# Patient Record
Sex: Female | Born: 1970 | Race: Black or African American | Hispanic: No | Marital: Single | Smoking: Never smoker
Health system: Southern US, Community
[De-identification: ages and names within clinical notes are randomized; demographics above are authoritative.]

## PROBLEM LIST (undated history)

## (undated) DIAGNOSIS — R000 Tachycardia, unspecified: Secondary | ICD-10-CM

## (undated) DIAGNOSIS — K5909 Other constipation: Secondary | ICD-10-CM

## (undated) DIAGNOSIS — N83209 Unspecified ovarian cyst, unspecified side: Secondary | ICD-10-CM

## (undated) DIAGNOSIS — K589 Irritable bowel syndrome without diarrhea: Secondary | ICD-10-CM

## (undated) DIAGNOSIS — D219 Benign neoplasm of connective and other soft tissue, unspecified: Secondary | ICD-10-CM

## (undated) HISTORY — PX: OVARIAN CYST SURGERY: SHX726

## (undated) HISTORY — PX: UTERINE FIBROID SURGERY: SHX826

## (undated) HISTORY — DX: Unspecified ovarian cyst, unspecified side: N83.209

---

## 2012-12-09 ENCOUNTER — Encounter (HOSPITAL_COMMUNITY): Payer: Self-pay | Admitting: *Deleted

## 2012-12-09 ENCOUNTER — Emergency Department (HOSPITAL_COMMUNITY)
Admission: EM | Admit: 2012-12-09 | Discharge: 2012-12-09 | Disposition: A | Discharge status: Home / Self Care | Payer: BC Managed Care – PPO | Attending: Emergency Medicine | Admitting: Emergency Medicine

## 2012-12-09 ENCOUNTER — Emergency Department (HOSPITAL_COMMUNITY): Payer: BC Managed Care – PPO

## 2012-12-09 DIAGNOSIS — Z792 Long term (current) use of antibiotics: Secondary | ICD-10-CM | POA: Insufficient documentation

## 2012-12-09 DIAGNOSIS — Z8719 Personal history of other diseases of the digestive system: Secondary | ICD-10-CM | POA: Insufficient documentation

## 2012-12-09 DIAGNOSIS — J3489 Other specified disorders of nose and nasal sinuses: Secondary | ICD-10-CM | POA: Insufficient documentation

## 2012-12-09 DIAGNOSIS — S9030XA Contusion of unspecified foot, initial encounter: Secondary | ICD-10-CM | POA: Insufficient documentation

## 2012-12-09 DIAGNOSIS — Y929 Unspecified place or not applicable: Secondary | ICD-10-CM | POA: Insufficient documentation

## 2012-12-09 DIAGNOSIS — W208XXA Other cause of strike by thrown, projected or falling object, initial encounter: Secondary | ICD-10-CM | POA: Insufficient documentation

## 2012-12-09 DIAGNOSIS — Z79899 Other long term (current) drug therapy: Secondary | ICD-10-CM | POA: Insufficient documentation

## 2012-12-09 DIAGNOSIS — S9031XA Contusion of right foot, initial encounter: Secondary | ICD-10-CM

## 2012-12-09 DIAGNOSIS — Z8679 Personal history of other diseases of the circulatory system: Secondary | ICD-10-CM | POA: Insufficient documentation

## 2012-12-09 DIAGNOSIS — Y939 Activity, unspecified: Secondary | ICD-10-CM | POA: Insufficient documentation

## 2012-12-09 DIAGNOSIS — R0982 Postnasal drip: Secondary | ICD-10-CM | POA: Insufficient documentation

## 2012-12-09 HISTORY — DX: Irritable bowel syndrome without diarrhea: K58.9

## 2012-12-09 HISTORY — DX: Tachycardia, unspecified: R00.0

## 2012-12-09 NOTE — ED Notes (Signed)
Right foot with small area of swelling. No discoloration at this time.

## 2012-12-09 NOTE — ED Provider Notes (Signed)
Medical screening examination/treatment/procedure(s) were performed by non-physician practitioner and as supervising physician I was immediately available for consultation/collaboration.   Charles B. Sheldon, MD 12/09/12 1529 

## 2012-12-09 NOTE — ED Provider Notes (Signed)
History     CSN: [id number]  Arrival date & time 12/09/12  4098   First MD Initiated Contact with Patient 12/09/12 [phone]      Chief Complaint  Patient presents with  . Foot Injury    (Consider location/radiation/quality/duration/timing/severity/associated sxs/prior treatment) HPI Terri Bailey is a 42 y.o. female who presents with right foot pain after dropping a case of water on it 10 hours ago.  She states that her pain is 5/10 with a shoe on and 7/10 barefoot while ambulating.  She is in little discomfort while not on the foot.  She states that she iced the foot immediately after thie incident and noticed that it hurt more this morning than immediately after it happened, so she came to have it xray-ed.  She has not taken any medications for her pain. She denies fever.  No joint pain of the ankle or toes.  Patient currently on amoxicillin for URI. Past Medical History  Diagnosis Date  . IBS (irritable bowel syndrome)   . Sinus tachycardia     Past Surgical History  Procedure Date  . Ovarian cyst surgery     No family history on file.  History  Substance Use Topics  . Smoking status: Never Smoker   . Smokeless tobacco: Not on file  . Alcohol Use: Yes     Comment: socially    OB History    Grav Para Term Preterm Abortions TAB SAB Ect Mult Living                  Review of Systems  Constitutional: Negative for fever.  HENT: Positive for congestion, rhinorrhea and postnasal drip. Negative for ear pain and neck pain.   Respiratory: Negative for cough, chest tightness and wheezing.   Cardiovascular: Negative for chest pain.  Musculoskeletal: Negative for myalgias, joint swelling and arthralgias.       Complains of pain with ambulation barefoot.  Neurological: Negative for weakness.    Allergies  Review of patient's allergies indicates no known allergies.  Home Medications   Current Outpatient Rx  Name  Route  Sig  Dispense  Refill  . AMOXICILLIN 875 MG PO  TABS   Oral   Take 875 mg by mouth 2 (two) times daily. 10 day dose started 5 days ago for URI         . BIOTIN PO   Oral   Take 1 tablet by mouth daily.         Marland Kitchen CALCIUM PO   Oral   Take 1 tablet by mouth daily.         Marland Kitchen GLUCOSAMINE PO   Oral   Take 2 tablets by mouth daily.         . ADULT MULTIVITAMIN W/MINERALS CH   Oral   Take 2 tablets by mouth daily.         Marland Kitchen FISH OIL PO   Oral   Take 2 capsules by mouth daily.         Marland Kitchen OVER THE COUNTER MEDICATION   Oral   Take 1 packet by mouth daily. Emergency Vitamin C         . POLYETHYLENE GLYCOL 3350 PO PACK   Oral   Take 17 g by mouth 2 (two) times daily.         Marland Kitchen ALIGN PO   Oral   Take 1 tablet by mouth daily.         . SUDAFED PO  Oral   Take 1 tablet by mouth every 4 (four) hours as needed. For cold symptoms           BP 105/65  Pulse 107  Temp 98.2 F (36.8 C) (Oral)  Resp 16  SpO2 98%  LMP 12/03/2012  Physical Exam  Constitutional: She is oriented to person, place, and time. She appears well-developed and well-nourished. No distress.  Neck: Normal range of motion.  Cardiovascular: Normal rate, regular rhythm and normal heart sounds.   Pulses:      Dorsalis pedis pulses are 2+ on the right side, and 2+ on the left side.  Pulmonary/Chest: Effort normal and breath sounds normal. She has no wheezes.  Musculoskeletal: She exhibits edema and tenderness.       Slight tenderness and edema over right 5th metatarsal.  No joint involvement of ankle or phalanges (normal ROM)  Lymphadenopathy:    She has no cervical adenopathy.  Neurological: She is alert and oriented to person, place, and time.  Reflex Scores:      Achilles reflexes are 2+ on the right side and 2+ on the left side. Skin: Skin is warm and dry. No rash noted.    ED Course  Procedures (including critical care time)  Labs Reviewed - No data to display No results found. No results found for this or any previous  visit. Dg Foot Complete Right  12/09/2012  *RADIOLOGY REPORT*  Clinical Data: Foot injury  RIGHT FOOT COMPLETE - 3+ VIEW  Comparison: None.  Findings: Three views of the right foot submitted.  No acute fracture or subluxation.  Small plantar spur of the calcaneus.  IMPRESSION: No acute fracture or subluxation.  Small plantar spur of the calcaneus.   Original Report Authenticated By: Natasha Mead, M.D.       No diagnosis found. 1. Right foot contusion   MDM  Right foot pain after case of water dropped on foot with negative xray.  Patient advised to take ibuprofen as needed for pain and swelling (mild at this point) and ambulate as tolerated.  She has OTC advil and does not wish for a rx at this time.  Patient told to follow-up with PCP if pain increased or does not improve.  Continue amoxicillin for URI, symptoms improving.   Patient acknowledges and agrees with this treatment plan.        Rise Traeger, PA-C 12/09/12 0957  Shadrick Senne, PA-C 12/09/12 1610

## 2012-12-09 NOTE — ED Notes (Signed)
Pt dropped a bag of water bottles on right foot last nite and having pain

## 2012-12-09 NOTE — ED Notes (Signed)
Patient transported to X-ray 

## 2013-01-02 ENCOUNTER — Encounter: Payer: Self-pay | Admitting: Obstetrics and Gynecology

## 2013-01-07 ENCOUNTER — Ambulatory Visit (INDEPENDENT_AMBULATORY_CARE_PROVIDER_SITE_OTHER): Payer: BC Managed Care – PPO | Admitting: Internal Medicine

## 2013-01-07 ENCOUNTER — Encounter: Payer: Self-pay | Admitting: Internal Medicine

## 2013-01-07 VITALS — BP 110/70 | HR 71 | Temp 97.7°F | Resp 18 | Ht 64.0 in | Wt 128.0 lb

## 2013-01-07 DIAGNOSIS — N83209 Unspecified ovarian cyst, unspecified side: Secondary | ICD-10-CM

## 2013-01-07 DIAGNOSIS — Z8679 Personal history of other diseases of the circulatory system: Secondary | ICD-10-CM

## 2013-01-07 DIAGNOSIS — K5900 Constipation, unspecified: Secondary | ICD-10-CM

## 2013-01-07 DIAGNOSIS — F40298 Other specified phobia: Secondary | ICD-10-CM

## 2013-01-07 DIAGNOSIS — I471 Supraventricular tachycardia: Secondary | ICD-10-CM | POA: Insufficient documentation

## 2013-01-07 DIAGNOSIS — K5902 Outlet dysfunction constipation: Secondary | ICD-10-CM | POA: Insufficient documentation

## 2013-01-07 DIAGNOSIS — N946 Dysmenorrhea, unspecified: Secondary | ICD-10-CM

## 2013-01-07 DIAGNOSIS — F40243 Fear of flying: Secondary | ICD-10-CM | POA: Insufficient documentation

## 2013-01-07 DIAGNOSIS — D219 Benign neoplasm of connective and other soft tissue, unspecified: Secondary | ICD-10-CM | POA: Insufficient documentation

## 2013-01-07 LAB — CBC WITH DIFFERENTIAL/PLATELET
Basophils Relative: 1 % (ref 0–1)
Eosinophils Absolute: 0.1 10*3/uL (ref 0.0–0.7)
Eosinophils Relative: 2 % (ref 0–5)
Hemoglobin: 13 g/dL (ref 12.0–15.0)
Lymphs Abs: 1.5 10*3/uL (ref 0.7–4.0)
MCH: 31.1 pg (ref 26.0–34.0)
MCHC: 34.8 g/dL (ref 30.0–36.0)
MCV: 89.5 fL (ref 78.0–100.0)
Monocytes Relative: 7 % (ref 3–12)
RBC: 4.18 MIL/uL (ref 3.87–5.11)

## 2013-01-07 MED ORDER — IBUPROFEN 800 MG PO TABS: 800.0000 mg | ORAL_TABLET | Freq: Three times a day (TID) | ORAL | Status: DC | PRN

## 2013-01-07 MED ORDER — ALPRAZOLAM 0.25 MG PO TABS: ORAL_TABLET | ORAL | Status: DC

## 2013-01-07 MED ORDER — VALACYCLOVIR HCL 500 MG PO TABS: ORAL_TABLET | ORAL | Status: DC

## 2013-01-07 NOTE — Progress Notes (Signed)
Subjective:    Patient ID: Terri Bailey, female    DOB: [birth year], 42 y.o.   MRN: [id number]  HPI  New pt here for first visit.   Has lived in [place] for the past 2 months, formerly in [place].  Moved here for employment with [employer].  PMH of dysmenorrhea managed with Ibuprofen,  Chronic constipation evaluated by GI,  Sinus tachycardia, and she uses Xanax for flying phobia.    Chronic constipation:  She reports extensive eval in [place].  Told she had a "sleepy colon".  Uses Miralax daily bid,  Tried Amitiza but could not tolerate.  She would like a second opinion  Dysmennorrhea  800 mg Ibuprofen controls  Sinus tachycardia  Extensive work up in [place] per her report including seeing and electrophysiologist.  She reports having taken Toprol for years and stopped 6 mlonths ago due to BP being too low.  She has not had any tachcardia except when she flies as she is anxious with planes  Phobia flying  Uses occasional Xanas for this  No Known Allergies Past Medical History  Diagnosis Date  . IBS (irritable bowel syndrome)   . Sinus tachycardia    Past Surgical History  Procedure Laterality Date  . Ovarian cyst surgery     History   Social History  . Marital Status: Single    Spouse Name: N/A    Number of Children: N/A  . Years of Education: N/A   Occupational History  . Not on file.   Social History Main Topics  . Smoking status: Never Smoker   . Smokeless tobacco: Not on file  . Alcohol Use: Yes     Comment: socially  . Drug Use: No  . Sexually Active: No   Other Topics Concern  . Not on file   Social History Narrative  . No narrative on file   Family History  Problem Relation Age of Onset  . Diabetes Mother   . Hypertension Mother   . Heart disease Father   . Cancer Maternal Grandmother   . Stroke Maternal Grandmother   . Hypertension Paternal Grandmother   . Hypertension Paternal Grandfather    Patient Active Problem List  Diagnosis  . Unspecified  constipation   Current Outpatient Prescriptions on File Prior to Visit  Medication Sig Dispense Refill  . BIOTIN PO Take 1 tablet by mouth daily.      Marland Kitchen CALCIUM PO Take 1 tablet by mouth daily.      Marland Kitchen GLUCOSAMINE PO Take 2 tablets by mouth daily.      . Multiple Vitamin (MULTIVITAMIN WITH MINERALS) TABS Take 2 tablets by mouth daily.      . Omega-3 Fatty Acids (FISH OIL PO) Take 2 capsules by mouth daily.      Marland Kitchen OVER THE COUNTER MEDICATION Take 1 packet by mouth daily. Emergency Vitamin C      . polyethylene glycol (MIRALAX / GLYCOLAX) packet Take 17 g by mouth 2 (two) times daily.      . Probiotic Product (ALIGN PO) Take 1 tablet by mouth daily.       No current facility-administered medications on file prior to visit.      Review of Systems    see HPI Objective:   Physical Exam Physical Exam  Nursing note and vitals reviewed.  Constitutional: She is oriented to person, place, and time. She appears well-developed and well-nourished.  HENT:  Head: Normocephalic and atraumatic.  Cardiovascular: Normal rate and regular rhythm. Exam reveals no  gallop and no friction rub.  No murmur heard.  Pulmonary/Chest: Breath sounds normal. She has no wheezes. She has no rales.  Neurological: She is alert and oriented to person, place, and time.  Skin: Skin is warm and dry.  Psychiatric: She has a normal mood and affect. Her behavior is normal.              Assessment & Plan:  Chronic constipation:   She has tried  Amitiza, Miralax,  And multiple meds and would like a second opinion.  Will set up with [facility]  Dysmenorrhea  RX for Ibuprofen 800 mg q8h prn  Hereptic cold sores  OK for Valtrex for 3 days prn for outbreaks  Flying Phobia  Ok for prn Xanax use for plane trips  She has upcoming trip to [place]  History of sinus tachycardia  Off beta blocker now .  See me in office if tachycardia becomes a problem  Schedule CPE  See me as needed prn

## 2013-01-08 LAB — COMPREHENSIVE METABOLIC PANEL
Alkaline Phosphatase: 70 U/L (ref 39–117)
BUN: 19 mg/dL (ref 6–23)
CO2: 27 mEq/L (ref 19–32)
Creat: 1.04 mg/dL (ref 0.50–1.10)
Glucose, Bld: 74 mg/dL (ref 70–99)
Total Bilirubin: 0.4 mg/dL (ref 0.3–1.2)

## 2013-01-08 LAB — LIPID PANEL
Cholesterol: 205 mg/dL — ABNORMAL HIGH (ref 0–200)
HDL: 56 mg/dL (ref >39)
LDL Cholesterol: 134 mg/dL — ABNORMAL HIGH (ref 0–99)
Total CHOL/HDL Ratio: 3.7 Ratio
Triglycerides: 74 mg/dL (ref <150)
VLDL: 15 mg/dL (ref 0–40)

## 2013-01-15 ENCOUNTER — Encounter: Payer: Self-pay | Admitting: *Deleted

## 2013-01-20 ENCOUNTER — Emergency Department (HOSPITAL_BASED_OUTPATIENT_CLINIC_OR_DEPARTMENT_OTHER): Payer: BC Managed Care – PPO

## 2013-01-20 ENCOUNTER — Emergency Department (HOSPITAL_BASED_OUTPATIENT_CLINIC_OR_DEPARTMENT_OTHER)
Admission: EM | Admit: 2013-01-20 | Discharge: 2013-01-21 | Disposition: A | Discharge status: Home / Self Care | Payer: BC Managed Care – PPO | Attending: Emergency Medicine | Admitting: Emergency Medicine

## 2013-01-20 ENCOUNTER — Encounter (HOSPITAL_BASED_OUTPATIENT_CLINIC_OR_DEPARTMENT_OTHER): Payer: Self-pay | Admitting: *Deleted

## 2013-01-20 DIAGNOSIS — K5900 Constipation, unspecified: Secondary | ICD-10-CM | POA: Insufficient documentation

## 2013-01-20 DIAGNOSIS — R300 Dysuria: Secondary | ICD-10-CM | POA: Insufficient documentation

## 2013-01-20 DIAGNOSIS — R109 Unspecified abdominal pain: Secondary | ICD-10-CM

## 2013-01-20 DIAGNOSIS — K589 Irritable bowel syndrome without diarrhea: Secondary | ICD-10-CM | POA: Insufficient documentation

## 2013-01-20 DIAGNOSIS — Z8679 Personal history of other diseases of the circulatory system: Secondary | ICD-10-CM | POA: Insufficient documentation

## 2013-01-20 DIAGNOSIS — R110 Nausea: Secondary | ICD-10-CM | POA: Insufficient documentation

## 2013-01-20 DIAGNOSIS — Z79899 Other long term (current) drug therapy: Secondary | ICD-10-CM | POA: Insufficient documentation

## 2013-01-20 DIAGNOSIS — R1012 Left upper quadrant pain: Secondary | ICD-10-CM | POA: Insufficient documentation

## 2013-01-20 HISTORY — DX: Other constipation: K59.09

## 2013-01-20 HISTORY — DX: Benign neoplasm of connective and other soft tissue, unspecified: D21.9

## 2013-01-20 LAB — CBC WITH DIFFERENTIAL/PLATELET
Basophils Absolute: 0 10*3/uL (ref 0.0–0.1)
Eosinophils Absolute: 0.1 10*3/uL (ref 0.0–0.7)
Eosinophils Relative: 1 % (ref 0–5)
HCT: 40 % (ref 36.0–46.0)
Lymphocytes Relative: 26 % (ref 12–46)
MCH: 31.5 pg (ref 26.0–34.0)
MCV: 93.2 fL (ref 78.0–100.0)
Monocytes Absolute: 0.5 10*3/uL (ref 0.1–1.0)
RDW: 11.8 % (ref 11.5–15.5)
WBC: 7.1 10*3/uL (ref 4.0–10.5)

## 2013-01-20 LAB — HEPATIC FUNCTION PANEL
ALT: 16 U/L (ref 0–35)
Alkaline Phosphatase: 81 U/L (ref 39–117)
Bilirubin, Direct: <0.1 mg/dL (ref 0.0–0.3)

## 2013-01-20 LAB — WET PREP, GENITAL: WBC, Wet Prep HPF POC: NONE SEEN

## 2013-01-20 LAB — BASIC METABOLIC PANEL
BUN: 23 mg/dL (ref 6–23)
CO2: 28 mEq/L (ref 19–32)
Chloride: 101 mEq/L (ref 96–112)
Creatinine, Ser: 1 mg/dL (ref 0.50–1.10)
Glucose, Bld: 100 mg/dL — ABNORMAL HIGH (ref 70–99)

## 2013-01-20 LAB — PREGNANCY, URINE: Preg Test, Ur: NEGATIVE

## 2013-01-20 MED ORDER — ONDANSETRON 8 MG PO TBDP: 8.0000 mg | ORAL_TABLET | Freq: Three times a day (TID) | ORAL | Status: DC | PRN

## 2013-01-20 MED ORDER — HYDROMORPHONE HCL PF 1 MG/ML IJ SOLN
1.0000 mg | Freq: Once | INTRAMUSCULAR | Status: AC
  Administered 2013-01-20: 1 mg via INTRAVENOUS
  Filled 2013-01-20: qty 1

## 2013-01-20 MED ORDER — IOHEXOL 300 MG/ML  SOLN
100.0000 mL | Freq: Once | INTRAMUSCULAR | Status: AC | PRN
  Administered 2013-01-20: 100 mL via INTRAVENOUS

## 2013-01-20 MED ORDER — HYDROCODONE-ACETAMINOPHEN 5-325 MG PO TABS: 1.0000 | ORAL_TABLET | Freq: Four times a day (QID) | ORAL | Status: DC | PRN

## 2013-01-20 MED ORDER — IOHEXOL 300 MG/ML  SOLN
50.0000 mL | Freq: Once | INTRAMUSCULAR | Status: AC | PRN
  Administered 2013-01-20: 50 mL via ORAL

## 2013-01-20 MED ORDER — ONDANSETRON HCL 4 MG/2ML IJ SOLN
4.0000 mg | Freq: Once | INTRAMUSCULAR | Status: AC
  Administered 2013-01-20: 4 mg via INTRAVENOUS
  Filled 2013-01-20: qty 2

## 2013-01-20 MED ORDER — FENTANYL CITRATE 0.05 MG/ML IJ SOLN
50.0000 ug | Freq: Once | INTRAMUSCULAR | Status: AC
  Administered 2013-01-20: 50 ug via INTRAVENOUS
  Filled 2013-01-20: qty 2

## 2013-01-20 NOTE — ED Provider Notes (Signed)
History    This chart was scribed for Derwood Kaplan, MD by Donne Anon, ED Scribe. This patient was seen in room MH01/MH01 and the patient's care was started at 2006.   CSN: [id number]  Arrival date & time 01/20/13  1843   First MD Initiated Contact with Patient 01/20/13 2006      Chief Complaint  Patient presents with  . Abdominal Pain     The history is provided by the patient. No language interpreter was used.   Terri Bailey is a 42 y.o. female who presents to the Emergency Department complaining of gradual onset, intermittent, chronic, gradually worsening moderate left upper quadrant abdominal pain which radiates downward, began 4 days ago, and is described as if "my insides are being twisted." She reports associated nausea and dysuria. She denies vomiting, fever, chills, diarrhea, hematochezia, vaginal discharge or any other pain. She has had previous episodes in the past and reports that this time feels similar. She denies noticing a correlation between any specific food and her abdominal pain. She has a h/o chronic constipation. Her last menstrual cycle was 01/03/2013 and her last bowl movement was today. She traveled overseas to [place] last week.  Past Medical History  Diagnosis Date  . IBS (irritable bowel syndrome)   . Sinus tachycardia   . Fibroid   . Constipation, chronic     Past Surgical History  Procedure Laterality Date  . Ovarian cyst surgery    . Uterine fibroid surgery      Family History  Problem Relation Age of Onset  . Diabetes Mother   . Hypertension Mother   . Heart disease Father   . Cancer Maternal Grandmother   . Stroke Maternal Grandmother   . Hypertension Paternal Grandmother   . Hypertension Paternal Grandfather     History  Substance Use Topics  . Smoking status: Never Smoker   . Smokeless tobacco: Never Used  . Alcohol Use: 0.0 oz/week    0 Glasses of wine per week     Review of Systems  Constitutional: Negative for fever and  chills.  Gastrointestinal: Positive for abdominal pain and constipation. Negative for vomiting and diarrhea.  Genitourinary: Negative for hematuria and vaginal discharge.  All other systems reviewed and are negative.    Allergies  Review of patient's allergies indicates no known allergies.  Home Medications   Current Outpatient Rx  Name  Route  Sig  Dispense  Refill  . ALPRAZolam (XANAX) 0.25 MG tablet      Take one 30 mins prior to flight   20 tablet   0   . Linaclotide (LINZESS PO)   Oral   Take by mouth.         Marland Kitchen BIOTIN PO   Oral   Take 1 tablet by mouth daily.         Marland Kitchen CALCIUM PO   Oral   Take 1 tablet by mouth daily.         Marland Kitchen GLUCOSAMINE PO   Oral   Take 2 tablets by mouth daily.         Marland Kitchen ibuprofen (ADVIL,MOTRIN) 800 MG tablet   Oral   Take 1 tablet (800 mg total) by mouth every 8 (eight) hours as needed for pain.   30 tablet   0   . Multiple Vitamin (MULTIVITAMIN WITH MINERALS) TABS   Oral   Take 2 tablets by mouth daily.         . Omega-3 Fatty Acids (FISH OIL  PO)   Oral   Take 2 capsules by mouth daily.         Marland Kitchen OVER THE COUNTER MEDICATION   Oral   Take 1 packet by mouth daily. Emergency Vitamin C         . polyethylene glycol (MIRALAX / GLYCOLAX) packet   Oral   Take 17 g by mouth 2 (two) times daily.         . Probiotic Product (ALIGN PO)   Oral   Take 1 tablet by mouth daily.         . valACYclovir (VALTREX) 500 MG tablet      Take one tablet bid for 3 days   6 tablet   1     BP 109/77  Pulse 73  Temp(Src) 97.7 F (36.5 C) (Oral)  Resp 20  SpO2 100%  LMP 01/03/2013  Physical Exam  Nursing note and vitals reviewed. Constitutional: She is oriented to person, place, and time. She appears well-developed and well-nourished. No distress.  HENT:  Head: Normocephalic and atraumatic.  Mouth/Throat: Oropharynx is clear and moist.  Eyes: EOM are normal.  Neck: Neck supple. No tracheal deviation present.   Cardiovascular: Normal rate, regular rhythm and normal heart sounds.   Pulmonary/Chest: Effort normal and breath sounds normal. No respiratory distress.  Musculoskeletal: Normal range of motion.  Neurological: She is alert and oriented to person, place, and time.  Skin: Skin is warm and dry.  Psychiatric: She has a normal mood and affect. Her behavior is normal.    ED Course  Procedures (including critical care time) DIAGNOSTIC STUDIES: Oxygen Saturation is 100% on room air, normal by my interpretation.    COORDINATION OF CARE: 8:12 PM Discussed treatment plan which includes labs with pt at bedside and pt agreed to plan.     Labs Reviewed  PREGNANCY, URINE   No results found.   No diagnosis found.    MDM  I personally performed the services described in this documentation, which was scribed in my presence. The recorded information has been reviewed and is accurate.  Pt comes in with cc of abd pain. Pt started having pain about 3 days ago, it is gradually getting worse. It is diffuse, but starts in the LUQ, no radiation to the back. She has no medical problems, no hx of GU pathology.  DDx includes: Pancreatitis Hepatobiliary pathology including cholecystitis Gastritis/PUD SBO Colitis Intra abdominal abscess Thrombosis Mesenteric ischemia Diverticulitis Peritonitis Appendicitis Hernia Nephrolithiasis Pyelonephritis UTI/Cystitis Ovarian cyst TOA Ectopic pregnancy PID STD  Will get a GU exam completed as well. Will either go with CT or US abdomen for further delineation of the pain.                Derwood Kaplan, MD 01/20/13 2102

## 2013-01-20 NOTE — ED Notes (Signed)
Burning abd pain x 2-3 days- denies n/v

## 2013-01-21 NOTE — ED Notes (Signed)
Pt asked about transportation home states she was calling a cab to transport herself home

## 2013-01-28 ENCOUNTER — Ambulatory Visit (INDEPENDENT_AMBULATORY_CARE_PROVIDER_SITE_OTHER): Payer: BC Managed Care – PPO | Admitting: Sports Medicine

## 2013-01-28 VITALS — BP 110/70 | Ht 64.0 in | Wt 120.0 lb

## 2013-01-28 DIAGNOSIS — M25562 Pain in left knee: Secondary | ICD-10-CM | POA: Insufficient documentation

## 2013-01-28 DIAGNOSIS — M25569 Pain in unspecified knee: Secondary | ICD-10-CM

## 2013-01-28 DIAGNOSIS — M25561 Pain in right knee: Secondary | ICD-10-CM

## 2013-01-28 DIAGNOSIS — R269 Unspecified abnormalities of gait and mobility: Secondary | ICD-10-CM

## 2013-01-28 NOTE — Assessment & Plan Note (Signed)
She has been in rigid orthotics  These are not giving enough correction now Make new orthotics on RTC to use for sports

## 2013-01-28 NOTE — Progress Notes (Signed)
Patient ID: Terri Bailey, female   DOB: 1971/04/05, 42 y.o.   MRN: [id number]  Patient with 1 year Hx of anterior knee pain Started after doing a lot of Zumba Had seen an MD and minor changes on Xray (in [place]) She has done 2 to 3 rounds of PT and not much change  PT did balance/ stretching/ band exercises No real hip rehab Dx was PFSS  Tried OTC creams/ icing and no change with this No significant medical conditions  No swelling No locking /  No giving way/ no rotary instability   fam HX: mother has some arthritis  Estate manager/land agent   Examination  NAD  Dynamic genius valgus on both knees on gait  Squinting patella bilat External Rots hips   Is 60  intenal is 45  she lies in int rotation Hip abduction weakness bilat Hip flexion is strong  Biilat Knee: Normal to inspection with no erythema or effusion or obvious bony abnormalities. Palpation normal with no warmth or joint line tenderness or patellar tenderness or condyle tenderness. ROM normal in flexion and extension and lower leg rotation. Ligaments with solid consistent endpoints including ACL, PCL, LCL, MCL. Negative Mcmurray's and provocative meniscal tests. Non painful patellar compression. But mild upper outer crepitation Note that she tracks laterally on both sides Patellar and quadriceps tendons unremarkable. Hamstring and quadriceps strength is normal except VMO weakness bilat  Bilat Pes planus with pronation Mostly at mid foot 1st MTP hypertrophy

## 2013-01-28 NOTE — Assessment & Plan Note (Signed)
Given HEP Focus hip for 6 exercises 2 specific exercises for VMO  Needs to do them daily Reck 6 weeks

## 2013-01-29 ENCOUNTER — Encounter: Payer: Self-pay | Admitting: Obstetrics and Gynecology

## 2013-01-29 ENCOUNTER — Ambulatory Visit: Payer: BC Managed Care – PPO | Admitting: Obstetrics and Gynecology

## 2013-01-29 VITALS — BP 92/58 | Ht 64.0 in | Wt 128.0 lb

## 2013-01-29 DIAGNOSIS — R1031 Right lower quadrant pain: Secondary | ICD-10-CM

## 2013-01-29 DIAGNOSIS — N898 Other specified noninflammatory disorders of vagina: Secondary | ICD-10-CM

## 2013-01-29 DIAGNOSIS — D219 Benign neoplasm of connective and other soft tissue, unspecified: Secondary | ICD-10-CM

## 2013-01-29 LAB — POCT URINALYSIS DIPSTICK
Blood, UA: NEGATIVE
Ketones, UA: NEGATIVE
Spec Grav, UA: 1.01
Urobilinogen, UA: NEGATIVE
pH, UA: 5

## 2013-01-29 LAB — POCT WET PREP (WET MOUNT)
Clue Cells Wet Prep Whiff POC: NEGATIVE
KOH Wet Prep POC: NEGATIVE
pH: 4.5

## 2013-01-29 LAB — POCT OSOM BVBLUE TEST: Bacterial Vaginosis: NEGATIVE

## 2013-01-29 LAB — POCT OSOM TRICHOMONAS RAPID TEST: Trichomonas vaginalis: NEGATIVE

## 2013-01-29 LAB — HIV ANTIBODY (ROUTINE TESTING W REFLEX): HIV: NONREACTIVE

## 2013-01-29 MED ORDER — KETOROLAC TROMETHAMINE 10 MG PO TABS: 10.0000 mg | ORAL_TABLET | Freq: Four times a day (QID) | ORAL | Status: DC | PRN

## 2013-01-29 NOTE — Progress Notes (Signed)
Vaginal discharge: pt states has had clear and thick  discharge with no odor  off and on x 1 year and has been on Diflucan multiple times and reoccurs  and sometimes worse than others.  Itching / Burning: Yes Pt states has had irritating burning  X 4 months and used Vagisil and noticed it didn't help. Fever: no  Symptoms have been present: Discharge x 1 year  Itching x 4 months Has used over-the-counter treatment: Vagisil Associated symptoms:  Pelvic pain: yes       Dyspareunia: no     Odor:  no  History of STD:  no history of PID, STD's STD screen: requested  Subjective:    Terri Bailey is a 42 y.o. female, G0P0000, who presents for vaginal discharge with itching on/off x 1 year. Intermittent pain on right side. No triggers. No medications taken for this. Not related to cycle  Cycle: normal every 28 days. Lot of spotting leading to bleeding. Lasting 4 days. Able to wear pad every 2 hours. Heavy on first 2-3 days. Golf ball size clots. Pain starts with heavy flow and last through heavy days. No radiation. Pain scale 8/10.Takes 800 mg of motrin every 8 hours bringing pain to 6/10   The following portions of the patient's history were reviewed and updated as appropriate: allergies, current medications, past family history.  Review of Systems Pertinent items are noted in HPI. Gastrointestinal: struggling with constipation. Just established care with Dr Loreta Ave for management of IBS Urinary:negative   Objective:    BP 92/58  Ht 5\' 4"  (1.626 m)  Wt 128 lb (58.06 kg)  BMI 21.96 kg/m2  LMP 01/03/2013    Weight:  Wt Readings from Last 1 Encounters:  01/29/13 128 lb (58.06 kg)          BMI: Body mass index is 21.96 kg/(m^2).  General Appearance: Alert, appropriate appearance for age. No acute distress HEENT: Grossly normal Neck / Thyroid: Supple, no masses, nodes or enlargement Lungs: clear to auscultation bilaterally Back: No CVA tenderness Breast Exam: deferred Cardiovascular:  Regular rate and rhythm. S1, S2, no murmur Gastrointestinal: Soft, non-tender, no masses or organomegaly Pelvic Exam: Vulva and vagina appear normal.No erythema. Moderate thick white discharge Bimanual exam reveals normal uterus and adnexa.Non-tender Rectovaginal: not indicated Lymphatic Exam: Non-palpable nodes in neck, clavicular, axillary, or inguinal regions Skin: no rash or abnormalities Neurologic: Normal gait and speech, no tremor  Psychiatric: Alert and oriented, appropriate affect.@OBJECTIVEEND @  Wet prep: 4.5 and normal OSOM BV:- OSOM Trichomonas:-   Assessment:    RLQ pain probably 2nd constipation  Vaginal discharge: normal findings today Heavy menses with severe dysmenorrhea 2nd fibroid disease   Plan:    STD screen done per pt request  Counseling:   Hygiene reviewed with avoidance of perfume, fragrance, soap, douche, feminine wash. Use unscented all-cotton sanitary pads or tampons.  Use dye and fragrance free laundry detergents for underwear.  Avoid Chlorox and dryer sheets.  Sleep without underwear.  Avoid prolonged wear of wet bathing suit or sweat-soaked tight clothes.  GC/CT done Toradol discussed: prescription given Lysteda Discussed / Information Given  Combined oral contraceptives was reviewed with the patient      With expected benefits of: cycle control, reduction in menstrual flow and dysmenorrhea, improvement of PMS and reduction of ovarian cysts and ovarian cancer. Risks of DVT/PE discussed.  Nuvaring was also reviewed With added benefit of monthly use as opposed to daily use was also reviewed.  Tobacco use: none, withouthistory of  DVT/PE. Pertinent medical history:none   Silverio Lay MD

## 2013-01-29 NOTE — Patient Instructions (Signed)
Tranexamic acid oral tablets What is this medicine? TRANEXAMIC ACID slows down or stops blood clots from being broken down. This medicine is used to treat heavy monthly menstrual bleeding. This medicine may be used for other purposes; ask your health care provider or pharmacist if you have questions. What should I tell my health care provider before I take this medicine? They need to know if you have any of these conditions: -bleeding in the brain -blood clotting problems -kidney disease -vision problems -an unusual allergic reaction to tranexamic acid, other medicines, foods, dyes, or preservatives -pregnant or trying to get pregnant -breast-feeding How should I use this medicine? Take this medicine by mouth with a glass of water. Follow the directions on the prescription label. Do not cut, crush, or chew this medicine. You can take it with or without food. If it upsets your stomach, take it with food. Take your medicine at regular intervals. Do not take it more often than directed. Do not stop taking except on your doctor's advice. Do not take this medicine until your period has started. Do not take it for more than 5 days in a row. Do not take this medicine when you do not have your period. Talk to your pediatrician regarding the use of this medicine in children. Special care may be needed. Overdosage: If you think you've taken too much of this medicine contact a poison control center or emergency room at once. Overdosage: If you think you have taken too much of this medicine contact a poison control center or emergency room at once. NOTE: This medicine is only for you. Do not share this medicine with others. What if I miss a dose? If you miss a dose, take it when you remember, and then take your next dose at least 6 hours later. Do not take more than 2 tablets at a time to make up for missed doses. What may interact with this medicine? -certain medicines used to help your blood clot or  break up blood clots -certain medicines used to treat leukemia -female hormones, like estrogens or progestins and birth control pills, patches, rings, or injections This list may not describe all possible interactions. Give your health care provider a list of all the medicines, herbs, non-prescription drugs, or dietary supplements you use. Also tell them if you smoke, drink alcohol, or use illegal drugs. Some items may interact with your medicine. What should I watch for while using this medicine? Tell your doctor or healthcare professional if your symptoms do not start to get better or if they get worse. Tell your doctor or healthcare professional if you notice any eye problems while taking this medicine. Your doctor will refer you to an eye doctor who will examine your eyes. What side effects may I notice from receiving this medicine? Side effects that you should report to your doctor or health care professional as soon as possible: -allergic reactions like skin rash, itching or hives, swelling of the face, lips, or tongue -breathing difficulties -changes in vision -sudden or severe pain in the chest, legs, head, or groin -unusually weak or tired  Side effects that usually do not require medical attention (Report these to your doctor or health care professional if they continue or are bothersome.): -back pain -headache -muscle or joint aches -sinus and nasal problems -stomach pain -tiredness This list may not describe all possible side effects. Call your doctor for medical advice about side effects. You may report side effects to FDA at 1-800-FDA-1088. Where  should I keep my medicine? Keep out of the reach of children. Store at room temperature between 15 and 30 degrees C (59 and 86 degrees F). Throw away any unused medicine after the expiration date. NOTE: This sheet is a summary. It may not cover all possible information. If you have questions about this medicine, talk to your doctor,  pharmacist, or health care provider.  2013, [employer]vier/Gold Standard. (06/07/2009 8:42:53 AM)

## 2013-01-30 LAB — GC/CHLAMYDIA PROBE AMP: CT Probe RNA: NEGATIVE

## 2013-01-30 LAB — HSV 2 ANTIBODY, IGG: HSV 2 Glycoprotein G Ab, IgG: <0.1 IV

## 2013-02-10 ENCOUNTER — Telehealth: Payer: Self-pay

## 2013-02-10 NOTE — Telephone Encounter (Signed)
Pt advised of results. Copy mailed to pt  Darien Ramus, CMA

## 2013-02-10 NOTE — Telephone Encounter (Signed)
Vm from pt wanting to get test results. LVM for pt to return call  Darien Ramus, CMA

## 2013-02-20 ENCOUNTER — Institutional Professional Consult (permissible substitution): Payer: BC Managed Care – PPO | Admitting: Cardiology

## 2013-02-20 ENCOUNTER — Encounter: Payer: BC Managed Care – PPO | Admitting: Sports Medicine

## 2013-02-28 ENCOUNTER — Institutional Professional Consult (permissible substitution): Payer: Self-pay | Admitting: Cardiovascular Disease

## 2013-03-06 ENCOUNTER — Institutional Professional Consult (permissible substitution): Payer: Self-pay | Admitting: Cardiovascular Disease

## 2013-03-31 ENCOUNTER — Encounter: Payer: Self-pay | Admitting: *Deleted

## 2013-04-08 ENCOUNTER — Ambulatory Visit: Payer: BC Managed Care – PPO | Admitting: Sports Medicine

## 2013-04-09 ENCOUNTER — Ambulatory Visit: Payer: BC Managed Care – PPO | Admitting: Sports Medicine

## 2013-04-10 ENCOUNTER — Encounter: Payer: BC Managed Care – PPO | Admitting: Internal Medicine

## 2013-04-16 ENCOUNTER — Ambulatory Visit (INDEPENDENT_AMBULATORY_CARE_PROVIDER_SITE_OTHER): Payer: BC Managed Care – PPO | Admitting: Cardiovascular Disease

## 2013-04-16 ENCOUNTER — Encounter: Payer: Self-pay | Admitting: Cardiovascular Disease

## 2013-04-16 VITALS — BP 110/76 | HR 80 | Ht 64.0 in | Wt 128.0 lb

## 2013-04-16 DIAGNOSIS — I471 Supraventricular tachycardia: Secondary | ICD-10-CM

## 2013-04-16 DIAGNOSIS — E7800 Pure hypercholesterolemia, unspecified: Secondary | ICD-10-CM

## 2013-04-16 DIAGNOSIS — E785 Hyperlipidemia, unspecified: Secondary | ICD-10-CM

## 2013-04-16 DIAGNOSIS — I498 Other specified cardiac arrhythmias: Secondary | ICD-10-CM

## 2013-04-16 MED ORDER — ATORVASTATIN CALCIUM 40 MG PO TABS: 40.0000 mg | ORAL_TABLET | Freq: Every day | ORAL | Status: DC

## 2013-04-16 NOTE — Progress Notes (Signed)
Terri Bailey Date of Birth  15-Mar-1971       [facility]    [place] [address]  [address] [place], [place]  [address]   [place], [place]  [address] [phone]     [phone]   Fax  [phone]    Fax [phone]  Problem List: 1. Palpitations  History of Present Illness:  Terri Bailey is a 42 yo with hx of tachycardia ( SVT vs. Sinus tach).   She was on toprol for years.  She stopped her toprol for hypotension and has not restarted.   She works at [employer]).   She typically exercises   She has an occasional episodes of chest discomfort but thinks it may be due to constipation. The chest pains are not related to exertion.  She is borderline elevated cholesterol levels. Her father died in his 34s of myocardial infarction. Her paternal grandmother also died at an early age-50s of MI.  She eats a very healthy diet.  Current Outpatient Prescriptions on File Prior to Visit  Medication Sig Dispense Refill  . ALPRAZolam (XANAX) 0.25 MG tablet Take one 30 mins prior to flight  20 tablet  0  . BIOTIN PO Take 1 tablet by mouth daily.      Marland Kitchen CALCIUM PO Take 1 tablet by mouth daily.      Marland Kitchen GLUCOSAMINE PO Take 2 tablets by mouth daily.      Marland Kitchen HYDROcodone-acetaminophen (NORCO/VICODIN) 5-325 MG per tablet Take 1 tablet by mouth every 6 (six) hours as needed for pain.  15 tablet  0  . ibuprofen (ADVIL,MOTRIN) 800 MG tablet Take 1 tablet (800 mg total) by mouth every 8 (eight) hours as needed for pain.  30 tablet  0  . ketorolac (TORADOL) 10 MG tablet Take 1 tablet (10 mg total) by mouth every 6 (six) hours as needed for pain.  20 tablet  4  . Linaclotide (LINZESS PO) Take by mouth.      . Multiple Vitamin (MULTIVITAMIN WITH MINERALS) TABS Take 2 tablets by mouth daily.      . Omega-3 Fatty Acids (FISH OIL PO) Take 2 capsules by mouth daily.      . ondansetron (ZOFRAN ODT) 8 MG disintegrating tablet Take 1 tablet (8 mg total) by mouth  every 8 (eight) hours as needed for nausea.  20 tablet  0  . OVER THE COUNTER MEDICATION Take 1 packet by mouth daily. Emergency Vitamin C      . polyethylene glycol (MIRALAX / GLYCOLAX) packet Take 17 g by mouth 2 (two) times daily.      . Probiotic Product (ALIGN PO) Take 1 tablet by mouth daily.      . valACYclovir (VALTREX) 500 MG tablet Take one tablet bid for 3 days  6 tablet  1   No current facility-administered medications on file prior to visit.    No Known Allergies  Past Medical History  Diagnosis Date  . IBS (irritable bowel syndrome)   . Sinus tachycardia   . Fibroid   . Constipation, chronic     Past Surgical History  Procedure Laterality Date  . Ovarian cyst surgery    . Uterine fibroid surgery      History  Smoking status  . Never Smoker   Smokeless tobacco  . Never Used    History  Alcohol Use  . 0.0 oz/week  . 0 Glasses of wine per week  Family History  Problem Relation Age of Onset  . Diabetes Mother   . Hypertension Mother   . Heart disease Father   . Cancer Maternal Grandmother   . Stroke Maternal Grandmother   . Hypertension Paternal Grandmother   . Hypertension Paternal Grandfather     Reviw of Systems:  Reviewed in the HPI.  All other systems are negative.  Physical Exam: Blood pressure 110/76, pulse 80, height 5\' 4"  (1.626 m), weight 128 lb (58.06 kg). General: Well developed, well nourished, in no acute distress.  Head: Normocephalic, atraumatic, sclera non-icteric, mucus membranes are moist,   Neck: Supple. Carotids are 2 + without bruits. No JVD   Lungs: Clear   Heart: RR, normal S1, S2, no murmurs  Abdomen: Soft, non-tender, non-distended with normal bowel sounds.  Msk:  Strength and tone are normal   Extremities: No clubbing or cyanosis. No edema.  Distal pedal pulses are 2+ and equal    Neuro: CN II - XII intact.  Alert and oriented X 3.   Psych:  Normal   ECG: Apr 16, 2013:  NSR at 80.  Normal  ECG  Assessment / Plan:

## 2013-04-16 NOTE — Patient Instructions (Addendum)
Your physician has requested that you have a stress echocardiogram.   Please follow instruction sheet as given.    Your physician recommends that you schedule a follow-up appointment in: 3 months   Your physician recommends that you return for a FASTING lipid profile: 3 month   Your physician has recommended you make the following change in your medication:   START ATORVASTATIN 40 MG TAKEN IN EVENING.

## 2013-04-16 NOTE — Assessment & Plan Note (Signed)
Her Last cholesterol levels are mildly elevated. She has a strong family history of coronary artery disease. Father died in his early 36s of MI. Also her paternal grandmother died in her 45s. Given this information I think it we should try to achieve an LDL level of 70. We will start her on atorvastatin 40 mg a day. I see her back in 3 months. We'll check fasting lipids, liver enzymes, and basic metabolic profile a day or so prior to the office visit.

## 2013-04-16 NOTE — Assessment & Plan Note (Signed)
I have reviewed her monitor traces. I think the tachycardia is due to supraventricular tachycardia. She typically will go from a heart rate of 80-180 ms. This is not typically associated with exertion.  She had lots of episodes of SVT around 10 years ago. She was on Toprol up until 6 months ago.  At this point she's not having much in the way of arrhythmias. We discussed 3 ways to terminate SVT such as Valsalva maneuver, diving reflex, and carotid sinus massage. We discussed the use of PRN  Propranolol.  At this point,  We will not restart the Toprol.  I will see her in 3 months.

## 2013-04-23 ENCOUNTER — Telehealth: Payer: Self-pay | Admitting: *Deleted

## 2013-04-23 NOTE — Telephone Encounter (Signed)
Left VM message regarding appt

## 2013-04-29 ENCOUNTER — Ambulatory Visit (INDEPENDENT_AMBULATORY_CARE_PROVIDER_SITE_OTHER): Payer: BC Managed Care – PPO | Admitting: Sports Medicine

## 2013-04-29 VITALS — BP 109/74 | Ht 64.0 in | Wt 125.0 lb

## 2013-04-29 DIAGNOSIS — R269 Unspecified abnormalities of gait and mobility: Secondary | ICD-10-CM

## 2013-04-29 DIAGNOSIS — M25569 Pain in unspecified knee: Secondary | ICD-10-CM

## 2013-04-29 DIAGNOSIS — M25561 Pain in right knee: Secondary | ICD-10-CM

## 2013-04-29 DIAGNOSIS — M25562 Pain in left knee: Secondary | ICD-10-CM

## 2013-04-29 NOTE — Progress Notes (Signed)
  Subjective:    Patient ID: Terri Bailey, female    DOB: 09/08/1971, 42 y.o.   MRN: [id number] Chief Complaint: bilateral knee pain HPI 42 yo female who presents for follow up on knee pain. Patient reports worsening bilateral pain. Pain has become more constant throughout the day and has gone from a 3 to a 5. It is worst with walking and better at rest although still present. She denies any swelling. She was seen on 01/28/13 for evaluation of knee pain. She was given home exercises but has not had a chance to do them.     Review of Systems Negative except per HPI    Objective:   Physical Exam Filed Vitals:   04/29/13 1507  BP: 109/74    General: pleasant young woman in no acute distress Knee exam:  no swelling or erythema, squinting patella present.  Palpation normal with no tenderness over patella or along medial and lateral joint lines, no condyle tenderness.  Normal range of motion with flexion and extension and leg rotation. 4/5 strength with hip abduction bilaterally, 4/5 strength with knee extension. 5/5 strength with knee flexion.  Negative McMurray's.  Pes planus. 5 degree of genius valgus on right.   MSK Ultrasound of bilateral knees: Irregular aspect beneath right patella. No arthritic changes or joint effusion on right.  Calcification in left medial meniscus. 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  - patient fitted for orthotics with heel wedge, with good correction of geniu valgus and pronation.  - encouraged home hip abduction strengthening exercises.  - follow up in 6-8 weeks

## 2013-04-29 NOTE — Assessment & Plan Note (Signed)
Because of her persistent knee pain and abnormal gait we made her a pair of custom orthotics today  Patient was fitted for a : standard, cushioned, semi-rigid orthotic. The orthotic was heated and afterward the patient stood on the orthotic blank positioned on the orthotic stand. The patient was positioned in subtalar neutral position and 10 degrees of ankle dorsiflexion in a weight bearing stance. After completion of molding, a stable base was applied to the orthotic blank. The blank was ground to a stable position for weight bearing. Size: 8 red eva Base: blue EVA Posting: medial wedges Additional orthotic padding: none  Time was 45 mins for evaluation and preparation  Walking gait was much improved after making orthotics These were comfortable and we will let her exercise in these for 2 months and then reevaluate

## 2013-04-29 NOTE — Assessment & Plan Note (Signed)
Hopefully the knee pain will lessen if we can improve her gait  We did encourage her to do some of the exercises to try to help lessen this as well  She is going to [place] for 7 weeks but if not improved I think she needs formal physical therapy on return

## 2013-04-30 ENCOUNTER — Encounter: Payer: Self-pay | Admitting: Cardiovascular Disease

## 2013-04-30 ENCOUNTER — Ambulatory Visit (HOSPITAL_BASED_OUTPATIENT_CLINIC_OR_DEPARTMENT_OTHER): Payer: BC Managed Care – PPO

## 2013-04-30 ENCOUNTER — Ambulatory Visit (HOSPITAL_COMMUNITY): Payer: BC Managed Care – PPO | Attending: Cardiology

## 2013-04-30 DIAGNOSIS — R0989 Other specified symptoms and signs involving the circulatory and respiratory systems: Secondary | ICD-10-CM

## 2013-04-30 DIAGNOSIS — E7800 Pure hypercholesterolemia, unspecified: Secondary | ICD-10-CM

## 2013-04-30 DIAGNOSIS — I498 Other specified cardiac arrhythmias: Secondary | ICD-10-CM | POA: Insufficient documentation

## 2013-04-30 DIAGNOSIS — I471 Supraventricular tachycardia: Secondary | ICD-10-CM

## 2013-04-30 DIAGNOSIS — R072 Precordial pain: Secondary | ICD-10-CM

## 2013-04-30 DIAGNOSIS — R079 Chest pain, unspecified: Secondary | ICD-10-CM | POA: Insufficient documentation

## 2013-04-30 NOTE — Progress Notes (Signed)
Echocardiogram performed.  

## 2013-05-01 ENCOUNTER — Encounter: Payer: BC Managed Care – PPO | Admitting: Internal Medicine

## 2013-05-02 ENCOUNTER — Telehealth: Payer: Self-pay | Admitting: Cardiovascular Disease

## 2013-05-02 NOTE — Telephone Encounter (Signed)
Results reviewed

## 2013-05-02 NOTE — Telephone Encounter (Signed)
New Problem  Pt is wanting the results of her stress test.

## 2013-05-26 ENCOUNTER — Encounter: Payer: BC Managed Care – PPO | Admitting: Internal Medicine

## 2013-05-29 ENCOUNTER — Encounter: Payer: Self-pay | Admitting: Internal Medicine

## 2013-07-03 ENCOUNTER — Other Ambulatory Visit: Payer: BC Managed Care – PPO

## 2013-07-04 ENCOUNTER — Other Ambulatory Visit: Payer: BC Managed Care – PPO

## 2013-07-07 ENCOUNTER — Ambulatory Visit: Payer: BC Managed Care – PPO | Admitting: Cardiovascular Disease

## 2013-07-30 ENCOUNTER — Encounter: Payer: Self-pay | Admitting: Internal Medicine

## 2013-07-30 ENCOUNTER — Ambulatory Visit (INDEPENDENT_AMBULATORY_CARE_PROVIDER_SITE_OTHER): Payer: BC Managed Care – PPO | Admitting: Internal Medicine

## 2013-07-30 VITALS — BP 102/69 | HR 85 | Temp 98.5°F | Resp 16 | Wt 131.0 lb

## 2013-07-30 DIAGNOSIS — I498 Other specified cardiac arrhythmias: Secondary | ICD-10-CM

## 2013-07-30 DIAGNOSIS — I471 Supraventricular tachycardia, unspecified: Secondary | ICD-10-CM

## 2013-07-30 DIAGNOSIS — E049 Nontoxic goiter, unspecified: Secondary | ICD-10-CM

## 2013-07-30 DIAGNOSIS — L309 Dermatitis, unspecified: Secondary | ICD-10-CM | POA: Insufficient documentation

## 2013-07-30 DIAGNOSIS — L259 Unspecified contact dermatitis, unspecified cause: Secondary | ICD-10-CM

## 2013-07-30 DIAGNOSIS — Z139 Encounter for screening, unspecified: Secondary | ICD-10-CM

## 2013-07-30 DIAGNOSIS — E785 Hyperlipidemia, unspecified: Secondary | ICD-10-CM

## 2013-07-30 DIAGNOSIS — K5900 Constipation, unspecified: Secondary | ICD-10-CM

## 2013-07-30 DIAGNOSIS — Z0000 Encounter for general adult medical examination without abnormal findings: Secondary | ICD-10-CM

## 2013-07-30 MED ORDER — DESONIDE 0.05 % EX LOTN: TOPICAL_LOTION | CUTANEOUS | Status: DC

## 2013-07-30 MED ORDER — VALACYCLOVIR HCL 500 MG PO TABS: 500.0000 mg | ORAL_TABLET | Freq: Two times a day (BID) | ORAL | Status: DC

## 2013-07-30 NOTE — Progress Notes (Signed)
Subjective:    Patient ID: Terri Bailey, female    DOB: 01/23/1971, 42 y.o.   MRN: [id number]  HPI  Terri Bailey is here for CPE  She has seen Dr. Darrick Penna and Dr. Melburn Popper  No episodes of palpitations even when exercising.    Her knees seem to feel better with orthotics  Hyperlipidemia  She is afraid to take the statin and stopped.  LDL  132 but strong FH of CAD  She has used Desonide for her facial eczema and needs a refill for this  No Known Allergies Past Medical History  Diagnosis Date  . IBS (irritable bowel syndrome)   . Sinus tachycardia   . Fibroid   . Constipation, chronic    Past Surgical History  Procedure Laterality Date  . Ovarian cyst surgery    . Uterine fibroid surgery     History   Social History  . Marital Status: Single    Spouse Name: N/A    Number of Children: N/A  . Years of Education: N/A   Occupational History  . Not on file.   Social History Main Topics  . Smoking status: Never Smoker   . Smokeless tobacco: Never Used  . Alcohol Use: 0.0 oz/week    0 Glasses of wine per week  . Drug Use: No  . Sexual Activity: No   Other Topics Concern  . Not on file   Social History Narrative  . No narrative on file   Family History  Problem Relation Age of Onset  . Diabetes Mother   . Hypertension Mother   . Heart disease Father   . Cancer Maternal Grandmother   . Stroke Maternal Grandmother   . Hypertension Paternal Grandmother   . Hypertension Paternal Grandfather    Patient Active Problem List   Diagnosis Date Noted  . Eczema 07/30/2013  . Hyperlipidemia 04/16/2013  . Knee pain, bilateral 01/28/2013  . Abnormality of gait 01/28/2013  . Unspecified constipation 01/07/2013  . Fibroid 01/07/2013  . SVT (supraventricular tachycardia) 01/07/2013  . Ovarian cyst 01/07/2013  . 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 BIOTIN PO Take 1 tablet by mouth daily.       Marland Kitchen CALCIUM PO Take 1 tablet by mouth daily.      Marland Kitchen GLUCOSAMINE PO Take 2 tablets by mouth daily.      Marland Kitchen ketorolac (TORADOL) 10 MG tablet Take 1 tablet (10 mg total) by mouth every 6 (six) hours as needed for pain.  20 tablet  4  . Multiple Vitamin (MULTIVITAMIN WITH MINERALS) TABS Take 2 tablets by mouth daily.      . Omega-3 Fatty Acids (FISH OIL PO) Take 2 capsules by mouth daily.      Marland Kitchen OVER THE COUNTER MEDICATION Take 1 packet by mouth daily. Emergency Vitamin C      . polyethylene glycol (MIRALAX / GLYCOLAX) packet Take 17 g by mouth 2 (two) times daily.      . Probiotic Product (ALIGN PO) Take 1 tablet by mouth daily.      Marland Kitchen ALPRAZolam (XANAX) 0.25 MG tablet Take one 30 mins prior to flight  20 tablet  0  . atorvastatin (LIPITOR) 40 MG tablet Take 1 tablet (40 mg total) by mouth daily.  30 tablet  6  . ibuprofen (ADVIL,MOTRIN) 800 MG tablet Take 1 tablet (800 mg total) by mouth every 8 (eight) hours as needed for pain.  30  tablet  0  . Linaclotide (LINZESS PO) Take by mouth.       No current facility-administered medications on file prior to visit.     Review of Systems  All other systems reviewed and are negative.       Objective:   Physical Exam Physical Exam  Nursing note and vitals reviewed.  Constitutional: She is oriented to person, place, and time. She appears well-developed and well-nourished.  HENT:  Head: Normocephalic and atraumatic.  Right Ear: Tympanic membrane and ear canal normal. No drainage. Tympanic membrane is not injected and not erythematous.  Left Ear: Tympanic membrane and ear canal normal. No drainage. Tympanic membrane is not injected and not erythematous.  Nose: Nose normal. Right sinus exhibits no maxillary sinus tenderness and no frontal sinus tenderness. Left sinus exhibits no maxillary sinus tenderness and no frontal sinus tenderness.  Mouth/Throat: Oropharynx is clear and moist. No oral lesions. No oropharyngeal exudate.  Eyes: Conjunctivae  and EOM are normal. Pupils are equal, round, and reactive to light.  Neck: Normal range of motion. Neck supple. No JVD present. Carotid bruit is not present. No mass present but thyroid feels slightly enlarged.   Cardiovascular: Normal rate, regular rhythm, S1 normal, S2 normal and intact distal pulses. Exam reveals no gallop and no friction rub.  No murmur heard.  Pulses:  Carotid pulses are 2+ on the right side, and 2+ on the left side.  Dorsalis pedis pulses are 2+ on the right side, and 2+ on the left side.  No carotid bruit. No LE edema  Pulmonary/Chest: Breath sounds normal. She has no wheezes. She has no rales. She exhibits no tenderness.   Breasts no discrete masses no nipple discharge no axillary adenpathy bilaterally Abdominal: Soft. Bowel sounds are normal. She exhibits no distension and no mass. There is no hepatosplenomegaly. There is no tenderness. There is no CVA tenderness.  Musculoskeletal: Normal range of motion.  No active synovitis to joints.  Lymphadenopathy:  She has no cervical adenopathy.  She has no axillary adenopathy.  Right: No inguinal and no supraclavicular adenopathy present.  Left: No inguinal and no supraclavicular adenopathy present.  Neurological: She is alert and oriented to person, place, and time. She has normal strength and normal reflexes. She displays no tremor. No cranial nerve deficit or sensory deficit. Coordination and gait normal.  Skin: Skin is warm and dry. No rash noted. No cyanosis. Nails show no clubbing.  Psychiatric: She has a normal mood and affect. Her speech is normal and behavior is normal. Cognition and memory are normal.           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

## 2013-07-30 NOTE — Patient Instructions (Addendum)
See me as needed 

## 2013-08-06 ENCOUNTER — Telehealth: Payer: Self-pay | Admitting: *Deleted

## 2013-08-06 NOTE — Telephone Encounter (Signed)
Terri Bailey would like another Rx of valtrex called in.  She is currently taking the one called in on 07/30/13. 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

## 2013-08-07 ENCOUNTER — Other Ambulatory Visit: Payer: Self-pay | Admitting: *Deleted

## 2013-08-07 MED ORDER — VALACYCLOVIR HCL 500 MG PO TABS: 500.0000 mg | ORAL_TABLET | Freq: Two times a day (BID) | ORAL | Status: DC

## 2013-08-07 NOTE — Telephone Encounter (Signed)
Refill request. See Heather's encounter. Pt states that she is having cold sore outbreaks back to back

## 2013-08-08 ENCOUNTER — Ambulatory Visit (HOSPITAL_BASED_OUTPATIENT_CLINIC_OR_DEPARTMENT_OTHER): Payer: BC Managed Care – PPO

## 2013-08-29 ENCOUNTER — Ambulatory Visit (HOSPITAL_BASED_OUTPATIENT_CLINIC_OR_DEPARTMENT_OTHER): Payer: BC Managed Care – PPO

## 2013-09-05 ENCOUNTER — Ambulatory Visit (HOSPITAL_BASED_OUTPATIENT_CLINIC_OR_DEPARTMENT_OTHER)
Admission: RE | Admit: 2013-09-05 | Discharge: 2013-09-05 | Disposition: A | Discharge status: Home / Self Care | Payer: BC Managed Care – PPO | Source: Ambulatory Visit | Attending: Internal Medicine | Admitting: Internal Medicine

## 2013-09-05 DIAGNOSIS — E049 Nontoxic goiter, unspecified: Secondary | ICD-10-CM

## 2013-09-08 ENCOUNTER — Telehealth: Payer: Self-pay | Admitting: Internal Medicine

## 2013-09-08 NOTE — Telephone Encounter (Signed)
Spoke with Victorino Dike and informed of thyroid ultrasound results

## 2013-09-21 ENCOUNTER — Telehealth: Payer: Self-pay | Admitting: Internal Medicine

## 2013-09-21 NOTE — Telephone Encounter (Signed)
Terri Bailey   I do not have the mm report in Epic fromm mm done 10/10  . Epic states exam ended but I do not see report .    Message back with their response

## 2013-09-22 NOTE — Telephone Encounter (Signed)
Spoke with Terri Bailey in [facility].  She said they are waiting on previous images to arrive before they read it.

## 2013-12-01 ENCOUNTER — Telehealth: Payer: Self-pay | Admitting: *Deleted

## 2013-12-01 ENCOUNTER — Other Ambulatory Visit: Payer: Self-pay | Admitting: *Deleted

## 2013-12-01 NOTE — Telephone Encounter (Signed)
Left VM message regarding the refill request for xanax.  Awaiting return call

## 2013-12-01 NOTE — Telephone Encounter (Signed)
Terri Bailey called in refill states that she is still only using it for travel and last refill was in Feb 2014.

## 2013-12-01 NOTE — Telephone Encounter (Signed)
Terri Bailey called and left message requesting her ALPRAZolam (XANAX) 0.25 MG tablet be refilled

## 2013-12-01 NOTE — Telephone Encounter (Signed)
Refill request will call in pending approval 

## 2013-12-01 NOTE — Telephone Encounter (Signed)
Terri Bailey  According to my notes ,  I gave her this one time for anxiety with flying    Is it for same reason??  Why does she need a refill

## 2013-12-02 ENCOUNTER — Other Ambulatory Visit: Payer: Self-pay | Admitting: Internal Medicine

## 2013-12-02 MED ORDER — ALPRAZOLAM 0.25 MG PO TABS: ORAL_TABLET | ORAL | Status: DC

## 2013-12-03 ENCOUNTER — Telehealth: Payer: Self-pay | Admitting: *Deleted

## 2013-12-03 ENCOUNTER — Ambulatory Visit (HOSPITAL_BASED_OUTPATIENT_CLINIC_OR_DEPARTMENT_OTHER)
Admission: RE | Admit: 2013-12-03 | Discharge: 2013-12-03 | Disposition: A | Discharge status: Home / Self Care | Payer: BC Managed Care – PPO | Source: Ambulatory Visit | Attending: Internal Medicine | Admitting: Internal Medicine

## 2013-12-03 ENCOUNTER — Ambulatory Visit (INDEPENDENT_AMBULATORY_CARE_PROVIDER_SITE_OTHER): Payer: BC Managed Care – PPO | Admitting: Internal Medicine

## 2013-12-03 ENCOUNTER — Encounter: Payer: Self-pay | Admitting: Internal Medicine

## 2013-12-03 VITALS — BP 102/70 | HR 73 | Temp 97.5°F | Resp 16 | Wt 128.0 lb

## 2013-12-03 DIAGNOSIS — M25579 Pain in unspecified ankle and joints of unspecified foot: Secondary | ICD-10-CM

## 2013-12-03 DIAGNOSIS — S46819A Strain of other muscles, fascia and tendons at shoulder and upper arm level, unspecified arm, initial encounter: Secondary | ICD-10-CM

## 2013-12-03 DIAGNOSIS — S46811A Strain of other muscles, fascia and tendons at shoulder and upper arm level, right arm, initial encounter: Secondary | ICD-10-CM

## 2013-12-03 DIAGNOSIS — S43499A Other sprain of unspecified shoulder joint, initial encounter: Secondary | ICD-10-CM

## 2013-12-03 DIAGNOSIS — M25571 Pain in right ankle and joints of right foot: Secondary | ICD-10-CM

## 2013-12-03 DIAGNOSIS — M79609 Pain in unspecified limb: Secondary | ICD-10-CM | POA: Insufficient documentation

## 2013-12-03 MED ORDER — IBUPROFEN 800 MG PO TABS: ORAL_TABLET | ORAL | Status: DC

## 2013-12-03 MED ORDER — METAXALONE 800 MG PO TABS: ORAL_TABLET | ORAL | Status: DC

## 2013-12-03 NOTE — Patient Instructions (Signed)
Hold upper body exercises for 2 weeks  Take Ibuprofen 800 mg bid for 2 weeks  Will refer to Dr. Dudley Major  Will refer to Dr. Barbaraann Barthel sports medicine    See me if not better

## 2013-12-03 NOTE — Progress Notes (Signed)
Subjective:    Patient ID: Terri Bailey, female    DOB: [birth year], 43 y.o.   MRN: [id number]  HPI Terri Bailey is here for acute visit.  She has been having several days of R foot pain on plantar surface.  She exercises 4 days a week on bike and does trapezius pulls  (up to 50lbs).    She denies injury or trauma of her foot.  She does have orthotics but only for her tennis shoes  She also has lots of muscle pain along upper back - trapezius area.  She does pull up to 50 lbs at gym.  No Sob no chest pain.    No injury or trauma to her back.  She has tried massage but not helping.  She has used Skelaxin in the past and it has helped.  Only using Ibuprofen 800 mg rarely    No Known Allergies Past Medical History  Diagnosis Date  . IBS (irritable bowel syndrome)   . Sinus tachycardia   . Fibroid   . Constipation, chronic   . Ovarian cyst    Past Surgical History  Procedure Laterality Date  . Ovarian cyst surgery    . Uterine fibroid surgery     History   Social History  . Marital Status: Single    Spouse Name: N/A    Number of Children: N/A  . Years of Education: N/A   Occupational History  . Not on file.   Social History Main Topics  . Smoking status: Never Smoker   . Smokeless tobacco: Never Used  . Alcohol Use: 0.0 oz/week    0 Glasses of wine per week  . Drug Use: No  . Sexual Activity: No   Other Topics Concern  . Not on file   Social History Narrative  . No narrative on file   Family History  Problem Relation Age of Onset  . Diabetes Mother   . Hypertension Mother   . Heart disease Father   . Cancer Maternal Grandmother   . Stroke Maternal Grandmother   . Hypertension Paternal Grandmother   . Hypertension Paternal Grandfather    Patient Active Problem List   Diagnosis Date Noted  . Eczema 07/30/2013  . Hyperlipidemia 04/16/2013  . Knee pain, bilateral 01/28/2013  . Abnormality of gait 01/28/2013  . Unspecified constipation 01/07/2013  . Fibroid  01/07/2013  . SVT (supraventricular tachycardia) 01/07/2013  . Ovarian cyst 01/07/2013  . 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 ALPRAZolam (XANAX) 0.25 MG tablet Take one 30 mins prior to flight  10 tablet  0  . BIOTIN PO Take 1 tablet by mouth daily.      Marland Kitchen CALCIUM PO Take 1 tablet by mouth daily.      Marland Kitchen desonide (DESOWEN) 0.05 % lotion Apply twice a day for no longer than 2 weeks  59 mL  0  . GLUCOSAMINE PO Take 2 tablets by mouth daily.      Marland Kitchen ibuprofen (ADVIL,MOTRIN) 800 MG tablet Take 1 tablet (800 mg total) by mouth every 8 (eight) hours as needed for pain.  30 tablet  0  . Multiple Vitamin (MULTIVITAMIN WITH MINERALS) TABS Take 2 tablets by mouth daily.      . Omega-3 Fatty Acids (FISH OIL PO) Take 2 capsules by mouth daily.      Marland Kitchen OVER THE COUNTER MEDICATION Take 1 packet by mouth daily. Emergency Vitamin C      .  polyethylene glycol (MIRALAX / GLYCOLAX) packet Take 17 g by mouth 2 (two) times daily.      . Probiotic Product (ALIGN PO) Take 1 tablet by mouth daily.      Marland Kitchen atorvastatin (LIPITOR) 40 MG tablet Take 1 tablet (40 mg total) by mouth daily.  30 tablet  6  . ketorolac (TORADOL) 10 MG tablet Take 1 tablet (10 mg total) by mouth every 6 (six) hours as needed for pain.  20 tablet  4  . Linaclotide (LINZESS PO) Take by mouth.      . norethindrone-ethinyl estradiol (JUNEL FE,GILDESS FE,LOESTRIN FE) 1-20 MG-MCG tablet Take 1 tablet by mouth daily.       No current facility-administered medications on file prior to visit.      Review of Systems    see HPI Objective:   Physical Exam Physical Exam  Nursing note and vitals reviewed.  Constitutional: She is oriented to person, place, and time. She appears well-developed and well-nourished.  HENT:  Head: Normocephalic and atraumatic.  Cardiovascular: Normal rate and regular rhythm. Exam reveals no gallop and no friction rub.  No murmur heard.    Pulmonary/Chest: Breath sounds normal. She has no wheezes. She has no rales.  BAck  musle stiffness along trapezius and thoracic paravertebral muscles.  Seh is point tender in that area R foot  Achilles test normal.   Tender along plantar fascia  No pain or deformity of metatarsal joints Neurological: She is alert and oriented to person, place, and time.  Skin: Skin is warm and dry.  Psychiatric: She has a normal mood and affect. Her behavior is normal.         Assessment & Plan:  Trapezius strain/ overuse.    Hold on upper body exercises for 2 weeks  .  Ibuprofen bid for 14 days.  I gave written RX for skelaxin 800 mg bid if NSaid does not work  Will refer to sports med  Probable plantar fasciitis  Will get Foot xray today.  She likely will need new orthotics  Will refer to Podiatry  Advised to call me if not better

## 2013-12-04 NOTE — Telephone Encounter (Signed)
Message copied by Conley Rolls on Thu Dec 04, 2013  9:25 AM ------      Message from: Emi Belfast D      Created: Wed Dec 03, 2013  1:18 PM       Zong Mcquarrie            Call pt and let her know that her foot xray is normal ------

## 2013-12-04 NOTE — Telephone Encounter (Signed)
Notified pt of - Foot X Ray

## 2013-12-15 ENCOUNTER — Ambulatory Visit (INDEPENDENT_AMBULATORY_CARE_PROVIDER_SITE_OTHER): Payer: BC Managed Care – PPO | Admitting: Podiatry

## 2013-12-15 ENCOUNTER — Encounter: Payer: Self-pay | Admitting: Podiatry

## 2013-12-15 VITALS — BP 107/73 | HR 69 | Ht 64.0 in | Wt 128.0 lb

## 2013-12-15 DIAGNOSIS — M216X9 Other acquired deformities of unspecified foot: Secondary | ICD-10-CM | POA: Insufficient documentation

## 2013-12-15 DIAGNOSIS — M722 Plantar fascial fibromatosis: Secondary | ICD-10-CM

## 2013-12-15 DIAGNOSIS — M21969 Unspecified acquired deformity of unspecified lower leg: Secondary | ICD-10-CM | POA: Insufficient documentation

## 2013-12-15 NOTE — Progress Notes (Signed)
Subjective: 43 year old female presents complaining of pain on right foot in arch area x 2 weeks. Stated that she has flat foot and always have some sort of mild discomfort in her feet. But this time the pain was unique. Patient points instep arch area of plantar fascial band being the source of foot pain. She has desk job. Exercises in weight training and bike x 6 days/week. She wears Orthotic shoe inserts in her tennis shoes.   Review of Systems - General ROS: negative for - chills, fatigue, fever, night sweats, sleep disturbance, weight gain or weight loss Ophthalmic ROS: negative ENT ROS: negative Allergy and Immunology ROS: negative Breast ROS: negative for breast lumps Respiratory ROS: no cough, shortness of breath, or wheezing Cardiovascular ROS: Has Sinus tachycardia and under management. Gastrointestinal ROS: Chronic constipation.  Genito-Urinary ROS: no dysuria, trouble voiding, or hematuria Musculoskeletal ROS: Knee pain on top of knee cap. Right side hurt more.  Neurological ROS: no TIA or stroke symptoms Dermatological ROS: negative.  Objective: Dermatologic: Normal findings. Vascular: All pedal pulses are palpable.  Neurologic: All epicritic and tactile sensation grossly intact.  Orthopedic: Abducted forefoot, elevated first ray with forefoot varus. Positive for STJ hyperpronation bilateral. Positive of tight Achilles tendon right, -5 degree from right angle at the ankle joint. Left foot has normal 10-15 degree dorsiflexion from right angle with knee extended.   Assessment: Pes planus with STJ hyperpronation. Forefoot varus with elevated first ray bilateral. Ankle equinus right. Plantar fasciitis right secondary to abnormal biomechanics; compensatory forefoot abductus due to limited ankle joint dorsiflexion, excess sagittal plane displacement of the first ray, and midfoot and rearfoot hyperpronation.  Plan: Reviewed findings and biomechanics of her feet, consequences and  compensatory result of tight Achilles tendon on right lower limb. Reviewed stretch exercise. Patient is to do stretch exercise daily. Patient is to use slim orthotics in her dress shoes. Will check her back in 2 months.

## 2013-12-15 NOTE — Patient Instructions (Signed)
Seen for right foot pain. Noted of weakened first Metatarsocuneiform joint both feet, and tight Achilles tendon on right. May benefit from stretch exercise.  Return in 2 months.

## 2013-12-26 ENCOUNTER — Encounter: Payer: Self-pay | Admitting: Family Medicine

## 2013-12-26 ENCOUNTER — Ambulatory Visit (INDEPENDENT_AMBULATORY_CARE_PROVIDER_SITE_OTHER): Payer: BC Managed Care – PPO | Admitting: Family Medicine

## 2013-12-26 VITALS — BP 101/68 | HR 69 | Ht 64.0 in | Wt 125.0 lb

## 2013-12-26 DIAGNOSIS — M542 Cervicalgia: Secondary | ICD-10-CM

## 2013-12-26 MED ORDER — TRAMADOL HCL 50 MG PO TABS: 50.0000 mg | ORAL_TABLET | Freq: Three times a day (TID) | ORAL | Status: DC | PRN

## 2013-12-26 NOTE — Patient Instructions (Signed)
You have trapezius and rhomboid spasms/strain.  Sometimes this can be due to a lower neck disc bulge. Regardless both are treated similarly initially. Start physical therapy and do home exercises on days you do not go. Consider prednisone. Tramadol as needed. Follow up with me in 1 month for reevaluation.

## 2013-12-29 ENCOUNTER — Encounter: Payer: Self-pay | Admitting: Family Medicine

## 2013-12-29 DIAGNOSIS — M542 Cervicalgia: Secondary | ICD-10-CM | POA: Insufficient documentation

## 2013-12-29 NOTE — Assessment & Plan Note (Signed)
consistent with trapezius and rhomboid strain/spasms.  Possible she has a low cervical radiculopathy.  Start with physical therapy.  Declined prednisone.  Tramadol as needed.  F/u in 1 month.  If not improving would consider prednisone, further imaging (radiographs, MRI).

## 2013-12-29 NOTE — Progress Notes (Signed)
Patient ID: Terri Bailey, female   DOB: 29-Jun-1971, 43 y.o.   MRN: [id number]  PCP: Kelton Pillar, MD  Subjective:   HPI: Patient is a 43 y.o. female here for right neck pain.  Patient reports she has had pain starting in December that has worsened. Last two weeks has been more constant. Tried skelaxin, ibuprofen - helped her along with PT a long time ago but these medicines haven't helped this time. Gets massage once a week. No numbness/tingling. Pain stays in upper back on right side. No bowel/bladder dysfunction.  Past Medical History  Diagnosis Date  . IBS (irritable bowel syndrome)   . Sinus tachycardia   . Fibroid   . Constipation, chronic   . Ovarian cyst     Current Outpatient Prescriptions on File Prior to Visit  Medication Sig Dispense Refill  . ALPRAZolam (XANAX) 0.25 MG tablet Take one 30 mins prior to flight  10 tablet  0  . atorvastatin (LIPITOR) 40 MG tablet Take 1 tablet (40 mg total) by mouth daily.  30 tablet  6  . BIOTIN PO Take 1 tablet by mouth daily.      Marland Kitchen CALCIUM PO Take 1 tablet by mouth daily.      Marland Kitchen desonide (DESOWEN) 0.05 % lotion Apply twice a day for no longer than 2 weeks  59 mL  0  . GLUCOSAMINE PO Take 2 tablets by mouth daily.      Marland Kitchen ibuprofen (ADVIL,MOTRIN) 800 MG tablet Take one tablet bid for two weeks then prn  60 tablet  0  . ketorolac (TORADOL) 10 MG tablet Take 1 tablet (10 mg total) by mouth every 6 (six) hours as needed for pain.  20 tablet  4  . LASTACAFT 0.25 % SOLN       . Linaclotide (LINZESS PO) Take by mouth.      . metaxalone (SKELAXIN) 800 MG tablet Take one tablet bid if needed for muscle spasm  30 tablet  1  . Multiple Vitamin (MULTIVITAMIN WITH MINERALS) TABS Take 2 tablets by mouth daily.      . norethindrone-ethinyl estradiol (JUNEL FE,GILDESS FE,LOESTRIN FE) 1-20 MG-MCG tablet Take 1 tablet by mouth daily.      . Omega-3 Fatty Acids (FISH OIL PO) Take 2 capsules by mouth daily.      Marland Kitchen OVER THE COUNTER MEDICATION  Take 1 packet by mouth daily. Emergency Vitamin C      . polyethylene glycol (MIRALAX / GLYCOLAX) packet Take 17 g by mouth 2 (two) times daily.      . Probiotic Product (ALIGN PO) Take 1 tablet by mouth daily.       No current facility-administered medications on file prior to visit.    Past Surgical History  Procedure Laterality Date  . Ovarian cyst surgery    . Uterine fibroid surgery      No Known Allergies  History   Social History  . Marital Status: Single    Spouse Name: N/A    Number of Children: N/A  . Years of Education: N/A   Occupational History  . Not on file.   Social History Main Topics  . Smoking status: Never Smoker   . Smokeless tobacco: Never Used  . Alcohol Use: 0.0 oz/week    0 Glasses of wine per week  . Drug Use: No  . Sexual Activity: No   Other Topics Concern  . Not on file   Social History Narrative  . No narrative on file  Family History  Problem Relation Age of Onset  . Diabetes Mother   . Hypertension Mother   . Hyperlipidemia Mother   . Heart disease Father   . Heart attack Father   . Sudden death Father   . Cancer Maternal Grandmother   . Stroke Maternal Grandmother   . Hypertension Paternal Grandmother   . Hypertension Paternal Grandfather     BP 101/68  Pulse 69  Ht 5\' 4"  (1.626 m)  Wt 125 lb (56.7 kg)  BMI 21.45 kg/m2  LMP 11/05/2013  Review of Systems: See HPI above.    Objective:  Physical Exam:  Gen: NAD  Neck: No gross deformity, swelling, bruising.  Spasms medial to medial scapular border. TTP trapezius, rhomboids on right side.  No midline/bony TTP. FROM neck - pain with bilateral lateral rotations. BUE strength 5/5.   Sensation intact to light touch.   2+ equal reflexes in triceps, biceps, brachioradialis tendons. Negative spurlings. NV intact distal BUEs.    Assessment & Plan:  1. Thoracic/Neck pain - consistent with trapezius and rhomboid strain/spasms.  Possible she has a low cervical  radiculopathy.  Start with physical therapy.  Declined prednisone.  Tramadol as needed.  F/u in 1 month.  If not improving would consider prednisone, further imaging (radiographs, MRI).

## 2014-01-01 ENCOUNTER — Ambulatory Visit: Payer: BC Managed Care – PPO | Attending: Family Medicine | Admitting: Rehabilitation

## 2014-01-01 DIAGNOSIS — M542 Cervicalgia: Secondary | ICD-10-CM | POA: Insufficient documentation

## 2014-01-01 DIAGNOSIS — M546 Pain in thoracic spine: Secondary | ICD-10-CM | POA: Insufficient documentation

## 2014-01-01 DIAGNOSIS — IMO0001 Reserved for inherently not codable concepts without codable children: Secondary | ICD-10-CM | POA: Insufficient documentation

## 2014-01-07 ENCOUNTER — Encounter: Payer: BC Managed Care – PPO | Admitting: Rehabilitation

## 2014-01-08 ENCOUNTER — Ambulatory Visit: Payer: BC Managed Care – PPO | Admitting: Rehabilitation

## 2014-01-12 ENCOUNTER — Ambulatory Visit: Payer: BC Managed Care – PPO | Admitting: Rehabilitation

## 2014-01-15 ENCOUNTER — Ambulatory Visit: Payer: BC Managed Care – PPO | Admitting: Rehabilitation

## 2014-01-19 ENCOUNTER — Ambulatory Visit: Payer: BC Managed Care – PPO | Admitting: Rehabilitation

## 2014-01-21 ENCOUNTER — Ambulatory Visit: Payer: BC Managed Care – PPO | Admitting: Internal Medicine

## 2014-01-22 ENCOUNTER — Ambulatory Visit: Payer: BC Managed Care – PPO | Admitting: Rehabilitation

## 2014-01-23 ENCOUNTER — Ambulatory Visit: Payer: BC Managed Care – PPO | Admitting: Family Medicine

## 2014-01-26 ENCOUNTER — Encounter: Payer: Self-pay | Admitting: Internal Medicine

## 2014-01-26 ENCOUNTER — Ambulatory Visit: Payer: BC Managed Care – PPO | Attending: Family Medicine | Admitting: Rehabilitation

## 2014-01-26 ENCOUNTER — Ambulatory Visit (INDEPENDENT_AMBULATORY_CARE_PROVIDER_SITE_OTHER): Payer: BC Managed Care – PPO | Admitting: Internal Medicine

## 2014-01-26 VITALS — BP 103/64 | HR 79 | Temp 98.5°F | Resp 16 | Wt 124.0 lb

## 2014-01-26 DIAGNOSIS — M542 Cervicalgia: Secondary | ICD-10-CM | POA: Insufficient documentation

## 2014-01-26 DIAGNOSIS — M546 Pain in thoracic spine: Secondary | ICD-10-CM | POA: Insufficient documentation

## 2014-01-26 DIAGNOSIS — J029 Acute pharyngitis, unspecified: Secondary | ICD-10-CM

## 2014-01-26 DIAGNOSIS — H6590 Unspecified nonsuppurative otitis media, unspecified ear: Secondary | ICD-10-CM

## 2014-01-26 LAB — POCT RAPID STREP A (OFFICE): Rapid Strep A Screen: NEGATIVE

## 2014-01-26 MED ORDER — AZITHROMYCIN 250 MG PO TABS: ORAL_TABLET | ORAL | Status: DC

## 2014-01-26 NOTE — Patient Instructions (Signed)
See me in 10 days for follow up

## 2014-01-26 NOTE — Progress Notes (Signed)
Subjective:    Patient ID: Terri Bailey, female    DOB: 1971/09/28, 43 y.o.   MRN: [id number]  HPI Terri Bailey is here for acute vist.  2 weeks of sore throat  NO documented  Fever or masses in neck.   No cough.  Ears feel full .  She is flying to [place] in a few days    No Known Allergies Past Medical History  Diagnosis Date  . IBS (irritable bowel syndrome)   . Sinus tachycardia   . Fibroid   . Constipation, chronic   . Ovarian cyst    Past Surgical History  Procedure Laterality Date  . Ovarian cyst surgery    . Uterine fibroid surgery     History   Social History  . Marital Status: Single    Spouse Name: N/A    Number of Children: N/A  . Years of Education: N/A   Occupational History  . Not on file.   Social History Main Topics  . Smoking status: Never Smoker   . Smokeless tobacco: Never Used  . Alcohol Use: 0.0 oz/week    0 Glasses of wine per week  . Drug Use: No  . Sexual Activity: No   Other Topics Concern  . Not on file   Social History Narrative  . No narrative on file   Family History  Problem Relation Age of Onset  . Diabetes Mother   . Hypertension Mother   . Hyperlipidemia Mother   . Heart disease Father   . Heart attack Father   . Sudden death Father   . Cancer Maternal Grandmother   . Stroke Maternal Grandmother   . Hypertension Paternal Grandmother   . Hypertension Paternal Grandfather    Patient Active Problem List   Diagnosis Date Noted  . Neck pain 12/29/2013  . Equinus deformity of foot, acquired 12/15/2013  . Plantar fasciitis of right foot 12/15/2013  . Metatarsal deformity 12/15/2013  . Trapezius muscle strain 12/03/2013  . Eczema 07/30/2013  . Hyperlipidemia 04/16/2013  . Knee pain, bilateral 01/28/2013  . Abnormality of gait 01/28/2013  . Unspecified constipation 01/07/2013  . Fibroid 01/07/2013  . SVT (supraventricular tachycardia) 01/07/2013  . Ovarian cyst 01/07/2013  . 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 ALPRAZolam (XANAX) 0.25 MG tablet Take one 30 mins prior to flight  10 tablet  0  . atorvastatin (LIPITOR) 40 MG tablet Take 1 tablet (40 mg total) by mouth daily.  30 tablet  6  . BIOTIN PO Take 1 tablet by mouth daily.      Marland Kitchen CALCIUM PO Take 1 tablet by mouth daily.      Marland Kitchen desonide (DESOWEN) 0.05 % lotion Apply twice a day for no longer than 2 weeks  59 mL  0  . GLUCOSAMINE PO Take 2 tablets by mouth daily.      Marland Kitchen ibuprofen (ADVIL,MOTRIN) 800 MG tablet Take one tablet bid for two weeks then prn  60 tablet  0  . ketorolac (TORADOL) 10 MG tablet Take 1 tablet (10 mg total) by mouth every 6 (six) hours as needed for pain.  20 tablet  4  . Linaclotide (LINZESS PO) Take by mouth.      . Multiple Vitamin (MULTIVITAMIN WITH MINERALS) TABS Take 2 tablets by mouth daily.      . norethindrone-ethinyl estradiol (JUNEL FE,GILDESS FE,LOESTRIN FE) 1-20 MG-MCG tablet Take 1 tablet by mouth daily.      Marland Kitchen  Omega-3 Fatty Acids (FISH OIL PO) Take 2 capsules by mouth daily.      Marland Kitchen OVER THE COUNTER MEDICATION Take 1 packet by mouth daily. Emergency Vitamin C      . polyethylene glycol (MIRALAX / GLYCOLAX) packet Take 17 g by mouth 2 (two) times daily.      . Probiotic Product (ALIGN PO) Take 1 tablet by mouth daily.      Marland Kitchen LASTACAFT 0.25 % SOLN       . metaxalone (SKELAXIN) 800 MG tablet Take one tablet bid if needed for muscle spasm  30 tablet  1  . traMADol (ULTRAM) 50 MG tablet Take 1 tablet (50 mg total) by mouth every 8 (eight) hours as needed.  60 tablet  0   No current facility-administered medications on file prior to visit.       Review of Systems See HPi    Objective:   Physical Exam Physical Exam  Constitutional: She is oriented to person, place, and time. She appears well-developed and well-nourished. She is cooperative.  HENT:  Head: Normocephalic and atraumatic.  Right Ear: A middle ear effusion is  present.  Left Ear: A middle ear effusion is present.  Nose: Mucosal edema present.  Mouth/Throat: Oropharyngeal exudate and posterior oropharyngeal erythema present.  Serous effusion bilaterally  Eyes: Conjunctivae and EOM are normal. Pupils are equal, round, and reactive to light.  Neck: Neck supple. Carotid bruit is not present. No mass present.  Cardiovascular: Regular rhythm, normal heart sounds, intact distal pulses and normal pulses. Exam reveals no gallop and no friction rub.  No murmur heard.  Pulmonary/Chest: Breath sounds normal. She has no wheezes. She has no rhonchi. She has no rales.  Lymphadenopathy:  She has cervical adenopathy.  Neurological: She is alert and oriented to person, place, and time.  Skin: Skin is warm and dry. No abrasion, no bruising, no ecchymosis and no rash noted. No cyanosis. Nails show no clubbing.  Psychiatric: She has a normal mood and affect. Her speech is normal and behavior is normal.            Assessment & Plan:  Pharyngitis  Will give Z-pack .  See me in 10 days . I instructed pt that is no improvement I will need to test for mononucleosis .  She voices understanding  Serous effusion Tm's  OTC decongestant prn

## 2014-01-26 NOTE — Addendum Note (Signed)
Addended by: Conley Rolls on: 01/26/2014 11:22 AM   Modules accepted: Orders

## 2014-01-29 ENCOUNTER — Ambulatory Visit: Payer: BC Managed Care – PPO | Admitting: Rehabilitation

## 2014-02-02 ENCOUNTER — Encounter: Payer: BC Managed Care – PPO | Admitting: Rehabilitation

## 2014-02-05 ENCOUNTER — Encounter: Payer: BC Managed Care – PPO | Admitting: Rehabilitation

## 2014-02-09 ENCOUNTER — Ambulatory Visit: Payer: BC Managed Care – PPO | Admitting: Rehabilitation

## 2014-02-09 ENCOUNTER — Other Ambulatory Visit: Payer: Self-pay | Admitting: *Deleted

## 2014-02-09 ENCOUNTER — Telehealth: Payer: Self-pay | Admitting: *Deleted

## 2014-02-09 ENCOUNTER — Ambulatory Visit: Payer: BC Managed Care – PPO | Admitting: Podiatry

## 2014-02-09 ENCOUNTER — Ambulatory Visit: Payer: BC Managed Care – PPO | Admitting: Internal Medicine

## 2014-02-09 NOTE — Telephone Encounter (Signed)
Refill request. Will call in pending approval 

## 2014-02-09 NOTE — Telephone Encounter (Signed)
Needs refill on ALPRAZolam Terri Bailey)

## 2014-02-10 MED ORDER — ALPRAZOLAM 0.25 MG PO TABS: ORAL_TABLET | ORAL | Status: DC

## 2014-02-10 NOTE — Telephone Encounter (Signed)
Xanax called in to [facility]

## 2014-02-11 ENCOUNTER — Ambulatory Visit (INDEPENDENT_AMBULATORY_CARE_PROVIDER_SITE_OTHER): Payer: BC Managed Care – PPO | Admitting: Sports Medicine

## 2014-02-11 ENCOUNTER — Encounter: Payer: Self-pay | Admitting: Sports Medicine

## 2014-02-11 ENCOUNTER — Encounter: Payer: BC Managed Care – PPO | Admitting: Rehabilitation

## 2014-02-11 ENCOUNTER — Ambulatory Visit: Payer: BC Managed Care – PPO | Admitting: Rehabilitation

## 2014-02-11 VITALS — BP 105/69 | Ht 64.0 in | Wt 125.0 lb

## 2014-02-11 DIAGNOSIS — M25562 Pain in left knee: Principal | ICD-10-CM

## 2014-02-11 DIAGNOSIS — R269 Unspecified abnormalities of gait and mobility: Secondary | ICD-10-CM

## 2014-02-11 DIAGNOSIS — M25561 Pain in right knee: Secondary | ICD-10-CM

## 2014-02-11 DIAGNOSIS — M25569 Pain in unspecified knee: Secondary | ICD-10-CM

## 2014-02-11 NOTE — Assessment & Plan Note (Signed)
Now sxs are only on RT  Use compression sleeve OTC Aleve prn HEP to strengthen hip abduction Reck 2 mos

## 2014-02-11 NOTE — Progress Notes (Signed)
   HPI:  Knee pain: has hx of knee pain x 2 years. Typically rides bike for exercise but recently started regimen of walking on treadmill and then running. She also has worn heels a lot at work recently. Thinks she exacerbated her knee pain with the heels and running. Localizes pain to medial aspect of knee slightly lateral to patella. R is significantly worse than L.  ROS: See HPI  [place]: works in [employer], mining, sits at desk  PHYSICAL EXAM:  Thin, fit B female BP 105/69  Ht 5\' 4"  (1.626 m)  Wt 125 lb (56.7 kg)  BMI 21.45 kg/m2  LMP 12/29/2013 Gen: NAD, pleasant, cooperative, well developed well nourished MSK: decreased strength of gluteus medius on R. Full strength on L. Full strength of all other hip abductors on R and L. Gait shows genu varus with R femoral anteversion. Medial displacement of knee caps with gait. RT shows some femoral anteversion at rest as well  ASSESSMENT/PLAN:  See problem based charting for assessment/plan.  FOLLOW UP: F/u in 6-8 weeks for recheck of knee pain  SIGNED: [place] J. Ardelia Mems, [place] Resident PGY-2 [place]

## 2014-02-11 NOTE — Assessment & Plan Note (Signed)
Cont to work on gait  Use orthotics  Add knee compression

## 2014-02-12 ENCOUNTER — Ambulatory Visit: Payer: BC Managed Care – PPO | Admitting: Family Medicine

## 2014-02-12 ENCOUNTER — Ambulatory Visit: Payer: BC Managed Care – PPO | Admitting: Rehabilitation

## 2014-02-13 ENCOUNTER — Ambulatory Visit: Payer: BC Managed Care – PPO | Admitting: Podiatry

## 2014-02-16 ENCOUNTER — Ambulatory Visit: Payer: BC Managed Care – PPO | Admitting: Rehabilitation

## 2014-02-19 ENCOUNTER — Ambulatory Visit: Payer: BC Managed Care – PPO | Admitting: Rehabilitation

## 2014-03-16 ENCOUNTER — Telehealth: Payer: Self-pay | Admitting: *Deleted

## 2014-03-16 ENCOUNTER — Other Ambulatory Visit: Payer: Self-pay | Admitting: *Deleted

## 2014-03-16 NOTE — Telephone Encounter (Signed)
Pt has several trips coming up and is requesting that we prescribe xanax 05 mg so that she doesn't  have to take so many of the .025mg . Pt states that she is flying to [place] and [place] in the next few weeks.

## 2014-03-17 ENCOUNTER — Ambulatory Visit: Payer: BC Managed Care – PPO | Admitting: Family Medicine

## 2014-03-17 MED ORDER — ALPRAZOLAM 0.5 MG PO TABS
ORAL_TABLET | ORAL | Status: DC
Start: ? — End: 2014-05-17

## 2014-03-17 NOTE — Telephone Encounter (Signed)
Terri Bailey    Ok to call in .05 as ordered

## 2014-03-19 NOTE — Telephone Encounter (Signed)
Xanax called in 

## 2014-03-23 ENCOUNTER — Telehealth: Payer: Self-pay | Admitting: *Deleted

## 2014-03-23 NOTE — Telephone Encounter (Signed)
Called in xanax to [facility] again

## 2014-03-23 NOTE — Telephone Encounter (Signed)
Terri Bailey called pharmacy, and they say that they did not get the Rx for Xanex that you called in.  Please call it in again for her.

## 2014-03-26 ENCOUNTER — Ambulatory Visit: Payer: BC Managed Care – PPO | Admitting: Sports Medicine

## 2014-03-30 ENCOUNTER — Encounter: Payer: Self-pay | Admitting: Internal Medicine

## 2014-04-27 ENCOUNTER — Telehealth: Payer: Self-pay | Admitting: *Deleted

## 2014-04-27 ENCOUNTER — Other Ambulatory Visit: Payer: Self-pay | Admitting: *Deleted

## 2014-04-27 NOTE — Telephone Encounter (Signed)
Terri Bailey Valtrex to [facility]

## 2014-04-30 ENCOUNTER — Telehealth: Payer: Self-pay | Admitting: *Deleted

## 2014-04-30 MED ORDER — VALACYCLOVIR HCL 500 MG PO TABS: ORAL_TABLET | ORAL | Status: DC

## 2014-04-30 NOTE — Telephone Encounter (Signed)
Let pt know I sent this ot [facility]

## 2014-04-30 NOTE — Telephone Encounter (Signed)
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

## 2014-04-30 NOTE — Telephone Encounter (Signed)
Pt is requesting a refill on valtrex. I do not see this as one of her medications pt states that she takes for cold sores. Pt will call back with previous RX information

## 2014-05-01 ENCOUNTER — Telehealth: Payer: Self-pay | Admitting: *Deleted

## 2014-05-01 MED ORDER — VALACYCLOVIR HCL 500 MG PO TABS: ORAL_TABLET | ORAL | Status: DC

## 2014-05-06 ENCOUNTER — Ambulatory Visit: Payer: BC Managed Care – PPO | Admitting: Internal Medicine

## 2014-05-07 ENCOUNTER — Ambulatory Visit: Payer: BC Managed Care – PPO | Admitting: Sports Medicine

## 2014-05-10 NOTE — Progress Notes (Signed)
Subjective:    Patient ID: Terri Bailey, female    DOB: 1971-11-02, 43 y.o.   MRN: [id number]  HPI  Terri Bailey is here for acute visit  No Known Allergies Past Medical History  Diagnosis Date  . IBS (irritable bowel syndrome)   . Sinus tachycardia   . Fibroid   . Constipation, chronic   . Ovarian cyst    Past Surgical History  Procedure Laterality Date  . Ovarian cyst surgery    . Uterine fibroid surgery     History   Social History  . Marital Status: Single    Spouse Name: N/A    Number of Children: N/A  . Years of Education: N/A   Occupational History  . Not on file.   Social History Main Topics  . Smoking status: Never Smoker   . Smokeless tobacco: Never Used  . Alcohol Use: 0.0 oz/week    0 Glasses of wine per week  . Drug Use: No  . Sexual Activity: No   Other Topics Concern  . Not on file   Social History Narrative  . No narrative on file   Family History  Problem Relation Age of Onset  . Diabetes Mother   . Hypertension Mother   . Hyperlipidemia Mother   . Heart disease Father   . Heart attack Father   . Sudden death Father   . Cancer Maternal Grandmother   . Stroke Maternal Grandmother   . Hypertension Paternal Grandmother   . Hypertension Paternal Grandfather    Patient Active Problem List   Diagnosis Date Noted  . Neck pain 12/29/2013  . Equinus deformity of foot, acquired 12/15/2013  . Plantar fasciitis of right foot 12/15/2013  . Metatarsal deformity 12/15/2013  . Trapezius muscle strain 12/03/2013  . Eczema 07/30/2013  . Hyperlipidemia 04/16/2013  . Knee pain, bilateral 01/28/2013  . Abnormality of gait 01/28/2013  .  Constipaiton by outlet obstruction  [facility] 01/07/2013  . Fibroid 01/07/2013  . SVT (supraventricular tachycardia) 01/07/2013  . Ovarian cyst 01/07/2013  . 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 ALPRAZolam (XANAX) 0.5 MG tablet  Take one 30 mins prior to flight  6 tablet  0  . atorvastatin (LIPITOR) 40 MG tablet Take 1 tablet (40 mg total) by mouth daily.  30 tablet  6  . azithromycin (ZITHROMAX) 250 MG tablet Take as directed  6 tablet  0  . BIOTIN PO Take 1 tablet by mouth daily.      Marland Kitchen CALCIUM PO Take 1 tablet by mouth daily.      Marland Kitchen desonide (DESOWEN) 0.05 % lotion Apply twice a day for no longer than 2 weeks  59 mL  0  . GLUCOSAMINE PO Take 2 tablets by mouth daily.      Marland Kitchen ibuprofen (ADVIL,MOTRIN) 800 MG tablet Take one tablet bid for two weeks then prn  60 tablet  0  . ketorolac (TORADOL) 10 MG tablet Take 1 tablet (10 mg total) by mouth every 6 (six) hours as needed for pain.  20 tablet  4  . LASTACAFT 0.25 % SOLN       . Linaclotide (LINZESS PO) Take by mouth.      . metaxalone (SKELAXIN) 800 MG tablet Take one tablet bid if needed for muscle spasm  30 tablet  1  . Multiple Vitamin (MULTIVITAMIN WITH MINERALS) TABS Take 2 tablets by mouth daily.      Marland Kitchen  norethindrone-ethinyl estradiol (JUNEL FE,GILDESS FE,LOESTRIN FE) 1-20 MG-MCG tablet Take 1 tablet by mouth daily.      . Omega-3 Fatty Acids (FISH OIL PO) Take 2 capsules by mouth daily.      Marland Kitchen OVER THE COUNTER MEDICATION Take 1 packet by mouth daily. Emergency Vitamin C      . polyethylene glycol (MIRALAX / GLYCOLAX) packet Take 17 g by mouth 2 (two) times daily.      . Probiotic Product (ALIGN PO) Take 1 tablet by mouth daily.      . traMADol (ULTRAM) 50 MG tablet Take 1 tablet (50 mg total) by mouth every 8 (eight) hours as needed.  60 tablet  0  . valACYclovir (VALTREX) 500 MG tablet One po twice a day for 3 days.  6 tablet  1   No current facility-administered medications on file prior to visit.      Review of Systems    see HPI Objective:   Physical Exam Physical Exam  Nursing note and vitals reviewed.  Constitutional: She is oriented to person, place, and time. She appears well-developed and well-nourished.  HENT:  Head: Normocephalic and  atraumatic.  Cardiovascular: Normal rate and regular rhythm. Exam reveals no gallop and no friction rub.  No murmur heard.  Pulmonary/Chest: Breath sounds normal. She has no wheezes. She has no rales.  Neurological: She is alert and oriented to person, place, and time.  Skin: Skin is warm and dry.  Psychiatric: She has a normal mood and affect. Her behavior is normal.             Assessment & Plan:

## 2014-05-11 ENCOUNTER — Encounter: Payer: BC Managed Care – PPO | Admitting: Internal Medicine

## 2014-05-11 DIAGNOSIS — R21 Rash and other nonspecific skin eruption: Secondary | ICD-10-CM

## 2014-05-14 ENCOUNTER — Telehealth: Payer: Self-pay | Admitting: *Deleted

## 2014-05-14 DIAGNOSIS — F419 Anxiety disorder, unspecified: Secondary | ICD-10-CM

## 2014-05-14 NOTE — Telephone Encounter (Signed)
Needs Xanax refill for traveling.  [facility] on [address]

## 2014-05-14 NOTE — Telephone Encounter (Signed)
Pt is going to travel and needs Xanax - Please advise if we can fill request.

## 2014-05-17 ENCOUNTER — Other Ambulatory Visit: Payer: Self-pay | Admitting: Internal Medicine

## 2014-05-17 MED ORDER — ALPRAZOLAM 0.5 MG PO TABS: ORAL_TABLET | ORAL | Status: DC

## 2014-05-18 MED ORDER — ALPRAZOLAM 0.5 MG PO TABS: ORAL_TABLET | ORAL | Status: DC

## 2014-05-23 ENCOUNTER — Encounter (HOSPITAL_COMMUNITY): Payer: Self-pay | Admitting: Emergency Medicine

## 2014-05-23 ENCOUNTER — Emergency Department (HOSPITAL_COMMUNITY)
Admission: EM | Admit: 2014-05-23 | Discharge: 2014-05-23 | Disposition: A | Discharge status: Home / Self Care | Payer: BC Managed Care – PPO | Attending: Emergency Medicine | Admitting: Emergency Medicine

## 2014-05-23 DIAGNOSIS — S61209A Unspecified open wound of unspecified finger without damage to nail, initial encounter: Secondary | ICD-10-CM | POA: Insufficient documentation

## 2014-05-23 DIAGNOSIS — Z23 Encounter for immunization: Secondary | ICD-10-CM | POA: Insufficient documentation

## 2014-05-23 DIAGNOSIS — Z8719 Personal history of other diseases of the digestive system: Secondary | ICD-10-CM | POA: Insufficient documentation

## 2014-05-23 DIAGNOSIS — IMO0002 Reserved for concepts with insufficient information to code with codable children: Secondary | ICD-10-CM | POA: Insufficient documentation

## 2014-05-23 DIAGNOSIS — Y929 Unspecified place or not applicable: Secondary | ICD-10-CM | POA: Insufficient documentation

## 2014-05-23 DIAGNOSIS — Z8742 Personal history of other diseases of the female genital tract: Secondary | ICD-10-CM | POA: Insufficient documentation

## 2014-05-23 DIAGNOSIS — Z79899 Other long term (current) drug therapy: Secondary | ICD-10-CM | POA: Insufficient documentation

## 2014-05-23 DIAGNOSIS — W268XXA Contact with other sharp object(s), not elsewhere classified, initial encounter: Secondary | ICD-10-CM | POA: Insufficient documentation

## 2014-05-23 DIAGNOSIS — Y93E5 Activity, floor mopping and cleaning: Secondary | ICD-10-CM | POA: Insufficient documentation

## 2014-05-23 DIAGNOSIS — S61012A Laceration without foreign body of left thumb without damage to nail, initial encounter: Secondary | ICD-10-CM

## 2014-05-23 MED ORDER — TETANUS-DIPHTH-ACELL PERTUSSIS 5-2.5-18.5 LF-MCG/0.5 IM SUSP
0.5000 mL | Freq: Once | INTRAMUSCULAR | Status: AC
  Administered 2014-05-23: 0.5 mL via INTRAMUSCULAR
  Filled 2014-05-23: qty 0.5

## 2014-05-23 NOTE — ED Notes (Signed)
Pt A+ox4, reports cut finger on a blender blade.  Pt with sm approx 1/2" flapped lac to L thumb, bleeding controlled with pressure.  Pt unsure of last tetanus.  Pt denies other complaints.  Skin otherwise PWD.  MAEI.  NAD.

## 2014-05-23 NOTE — ED Provider Notes (Signed)
CSN: [id number]     Arrival date & time 05/23/14  1701 History  This chart was scribed for non-physician practitioner, Monico Blitz, PA-C,working with Richarda Blade, MD, by Marlowe Kays, ED Scribe.  This patient was seen in room [place] and the patient's care was started at 5:57 PM.  Chief Complaint  Patient presents with  . Extremity Laceration   The history is provided by the patient. No language interpreter was used.   HPI Comments:  Terri Bailey is a 43 y.o. female who presents to the Emergency Department complaining of a laceration to her left thumb from a blender blade while cleaning it that occurred approximately two hours ago. Pt reports tolerable pain and mild bleeding that is now controlled. She denies fever, nausea or swelling of the area. Pt is unsure of last tetanus vaccination. Patient is right-hand dominant.  Past Medical History  Diagnosis Date  . IBS (irritable bowel syndrome)   . Sinus tachycardia   . Fibroid   . Constipation, chronic   . Ovarian cyst    Past Surgical History  Procedure Laterality Date  . Ovarian cyst surgery    . Uterine fibroid surgery     Family History  Problem Relation Age of Onset  . Diabetes Mother   . Hypertension Mother   . Hyperlipidemia Mother   . Heart disease Father   . Heart attack Father   . Sudden death Father   . Cancer Maternal Grandmother   . Stroke Maternal Grandmother   . Hypertension Paternal Grandmother   . Hypertension Paternal Grandfather    History  Substance Use Topics  . Smoking status: Never Smoker   . Smokeless tobacco: Never Used  . Alcohol Use: 0.0 oz/week    0 Glasses of wine per week   OB History   Grav Para Term Preterm Abortions TAB SAB Ect Mult Living   0 0 0 0 0 0 0 0 0 0      Review of Systems A complete 10 system review of systems was obtained and all systems are negative except as noted in the HPI and PMH.   Allergies  Review of patient's allergies indicates no known  allergies.  Home Medications   Prior to Admission medications   Medication Sig Start Date End Date Taking? Authorizing Provider  ALPRAZolam Duanne Moron) 0.5 MG tablet Take one 30 mins prior to flight 05/18/14  Yes Lanice Shirts, MD  BIOTIN PO Take 1 tablet by mouth daily.   Yes Historical Provider, MD  CALCIUM PO Take 1 tablet by mouth daily.   Yes Historical Provider, MD  desonide (DESOWEN) 0.05 % cream Apply 1 application topically 2 (two) times daily.   Yes Historical Provider, MD  GLUCOSAMINE PO Take 2 tablets by mouth daily.   Yes Historical Provider, MD  ketorolac (TORADOL) 10 MG tablet Take 1 tablet (10 mg total) by mouth every 6 (six) hours as needed for pain. 01/29/13  Yes Alwyn Pea, MD  Multiple Vitamin (MULTIVITAMIN WITH MINERALS) TABS Take 2 tablets by mouth daily.   Yes Historical Provider, MD  Omega-3 Fatty Acids (FISH OIL PO) Take 2 capsules by mouth daily.   Yes Historical Provider, MD  OVER THE COUNTER MEDICATION Take 1 packet by mouth daily. Emergency Vitamin C   Yes Historical Provider, MD  polyethylene glycol (MIRALAX / GLYCOLAX) packet Take 17 g by mouth 2 (two) times daily.   Yes Historical Provider, MD  PRESCRIPTION MEDICATION Apply 1 application topically daily as needed. Apply  vaginal days 12-26. Pt gets filled at [facility].   Yes Historical Provider, MD  Probiotic Product (ALIGN PO) Take 1 tablet by mouth daily.   Yes Historical Provider, MD  valACYclovir (VALTREX) 500 MG tablet Take 500 mg by mouth 2 (two) times daily as needed (out breaks).   Yes Historical Provider, MD  LASTACAFT 0.25 % SOLN  10/14/13   Historical Provider, MD   Triage Vitals: BP 136/83  Pulse 83  Temp(Src) 98.6 F (37 C) (Oral)  Resp 16  Ht 5\' 4"  (1.626 m)  Wt 123 lb (55.792 kg)  BMI 21.10 kg/m2  SpO2 100% Physical Exam  Nursing note and vitals reviewed. Constitutional: She is oriented to person, place, and time. She appears well-developed and well-nourished.  HENT:   Head: Normocephalic and atraumatic.  Eyes: EOM are normal.  Neck: Normal range of motion.  Cardiovascular: Normal rate, regular rhythm and intact distal pulses.   Pulmonary/Chest: Effort normal.  Musculoskeletal: Normal range of motion.  Neurological: She is alert and oriented to person, place, and time.  Skin: Skin is warm and dry.  1 cm flap-like laceration actively bleeding to the pad of left thumb.  Full-strength in flexion and extension. Neurovascularly intact.  Psychiatric: She has a normal mood and affect. Her behavior is normal.    ED Course  Procedures (including critical care time) DIAGNOSTIC STUDIES: Oxygen Saturation is 100% on RA, normal by my interpretation.   COORDINATION OF CARE: 6:01 PM- Will suture laceration. Pt verbalizes understanding and agrees to plan.  LACERATION REPAIR PROCEDURE NOTE The patient's identification was confirmed and consent was obtained. This procedure was performed by Monico Blitz at 6:33 PM. Site: pad of left thumb Sterile procedures observed Anesthetic used (type and amt): 1% lidocaine without epinephrine 5 mL Suture type/size: 5-0 Vicryl Rapide Length: 1 cm flap # of Sutures: 2 Technique: Simple interrupted Complexity simple Antibx ointment applied Tetanus ordered Site anesthetized, irrigated with NS, explored without evidence of foreign body, wound well approximated, site covered with dry, sterile dressing.  Patient tolerated procedure well without complications. Instructions for care discussed verbally and patient provided with additional written instructions for homecare and f/u.   Medications  Tdap (BOOSTRIX) injection 0.5 mL (not administered)    Labs Review Labs Reviewed - No data to display  Imaging Review No results found.   EKG Interpretation None      MDM   Final diagnoses:  Thumb laceration, left, initial encounter    Filed Vitals:   05/23/14 1733  BP: 136/83  Pulse: 83  Temp: 98.6 F (37  C)  TempSrc: Oral  Resp: 16  Height: 5\' 4"  (1.626 m)  Weight: 123 lb (55.792 kg)  SpO2: 100%    Medications  Tdap (BOOSTRIX) injection 0.5 mL (not administered)    Terri Bailey is a 43 y.o. female presenting with finger laceration.  No signs of tendon/joint involvement. Tdap booster given. Pressure irrigation performed. Laceration occurred < 8 hours prior to repair which was well tolerated. Pt has no co morbidities to effect normal wound healing. Discussed suture home care w pt and answered questions. Pt to f-u for wound check and suture removal in 7 days. Pt is hemodynamically stable with no complaints prior to dc.   Evaluation does not show pathology that would require ongoing emergent intervention or inpatient treatment. Pt is hemodynamically stable and mentating appropriately. Discussed findings and plan with patient/guardian, who agrees with care plan. All questions answered. Return precautions discussed and outpatient follow up  given.     Monico Blitz, PA-C 05/23/14 [place], PA-C 05/23/14 [id number]

## 2014-05-23 NOTE — Discharge Instructions (Signed)
Keep wound dry and do not remove dressing for 24 hours if possible. After that, wash gently morning and night (every 12 hours) with soap and water. Use a topical antibiotic ointment and cover with a bandaid or gauze.    Do NOT use rubbing alcohol or hydrogen peroxide, do not soak the area   Every attempt was made to remove foreign body (contaminants) from the wound.  However, there is always a chance that some may remain in the wound. This can  increase your risk of infection.   If you see signs of infection (warmth, redness, tenderness, pus, sharp increase in pain, fever, red streaking in the skin) immediately return to the emergency department.   After the wound heals fully, apply sunscreen for 6-12 months to minimize scarring.   Please follow with your primary care doctor in the next 2 days for a check-up. They must obtain records for further management.   Do not hesitate to return to the Emergency Department for any new, worsening or concerning symptoms.

## 2014-05-24 NOTE — ED Provider Notes (Signed)
Medical screening examination/treatment/procedure(s) were performed by non-physician practitioner and as supervising physician I was immediately available for consultation/collaboration.  Richarda Blade, MD 05/24/14 [phone]

## 2014-05-25 ENCOUNTER — Ambulatory Visit (INDEPENDENT_AMBULATORY_CARE_PROVIDER_SITE_OTHER): Payer: BC Managed Care – PPO | Admitting: Internal Medicine

## 2014-05-25 ENCOUNTER — Encounter: Payer: Self-pay | Admitting: Internal Medicine

## 2014-05-25 VITALS — BP 91/60 | HR 72 | Resp 16 | Ht 64.0 in | Wt 125.0 lb

## 2014-05-25 DIAGNOSIS — M79609 Pain in unspecified limb: Secondary | ICD-10-CM

## 2014-05-25 DIAGNOSIS — M79645 Pain in left finger(s): Secondary | ICD-10-CM

## 2014-05-25 MED ORDER — IBUPROFEN 800 MG PO TABS: ORAL_TABLET | ORAL | Status: DC

## 2014-05-25 NOTE — Progress Notes (Signed)
Subjective:    Patient ID: Terri Bailey, female    DOB: December 18, 1970, 43 y.o.   MRN: [id number]  HPI  Terri Bailey is here for acute visit.   She had laceration repair 2 days ago to left thumb and feels tingling during the day and throbbing while trying to sleep at night  No redness,  Slight swelling   Cut finger on a blender blade  No Known Allergies Past Medical History  Diagnosis Date  . IBS (irritable bowel syndrome)   . Sinus tachycardia   . Fibroid   . Constipation, chronic   . Ovarian cyst    Past Surgical History  Procedure Laterality Date  . Ovarian cyst surgery    . Uterine fibroid surgery     History   Social History  . Marital Status: Single    Spouse Name: N/A    Number of Children: N/A  . Years of Education: N/A   Occupational History  . Not on file.   Social History Main Topics  . Smoking status: Never Smoker   . Smokeless tobacco: Never Used  . Alcohol Use: 0.0 oz/week    0 Glasses of wine per week  . Drug Use: No  . Sexual Activity: No   Other Topics Concern  . Not on file   Social History Narrative  . No narrative on file   Family History  Problem Relation Age of Onset  . Diabetes Mother   . Hypertension Mother   . Hyperlipidemia Mother   . Heart disease Father   . Heart attack Father   . Sudden death Father   . Cancer Maternal Grandmother   . Stroke Maternal Grandmother   . Hypertension Paternal Grandmother   . Hypertension Paternal Grandfather    Patient Active Problem List   Diagnosis Date Noted  . Neck pain 12/29/2013  . Equinus deformity of foot, acquired 12/15/2013  . Plantar fasciitis of right foot 12/15/2013  . Metatarsal deformity 12/15/2013  . Trapezius muscle strain 12/03/2013  . Eczema 07/30/2013  . Hyperlipidemia 04/16/2013  . Knee pain, bilateral 01/28/2013  . Abnormality of gait 01/28/2013  .  Constipaiton by outlet obstruction  [facility] 01/07/2013  . Fibroid 01/07/2013  . SVT (supraventricular tachycardia)  01/07/2013  . Ovarian cyst 01/07/2013  . 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 ALPRAZolam (XANAX) 0.5 MG tablet Take one 30 mins prior to flight  6 tablet  0  . BIOTIN PO Take 1 tablet by mouth daily.      Marland Kitchen CALCIUM PO Take 1 tablet by mouth daily.      Marland Kitchen desonide (DESOWEN) 0.05 % cream Apply 1 application topically 2 (two) times daily.      Marland Kitchen GLUCOSAMINE PO Take 2 tablets by mouth daily.      Marland Kitchen ketorolac (TORADOL) 10 MG tablet Take 1 tablet (10 mg total) by mouth every 6 (six) hours as needed for pain.  20 tablet  4  . LASTACAFT 0.25 % SOLN       . Multiple Vitamin (MULTIVITAMIN WITH MINERALS) TABS Take 2 tablets by mouth daily.      . Omega-3 Fatty Acids (FISH OIL PO) Take 2 capsules by mouth daily.      Marland Kitchen OVER THE COUNTER MEDICATION Take 1 packet by mouth daily. Emergency Vitamin C      . polyethylene glycol (MIRALAX / GLYCOLAX) packet Take 17 g by mouth 2 (two) times  daily.      Marland Kitchen PRESCRIPTION MEDICATION Apply 1 application topically daily as needed. Apply vaginal days 12-26. Pt gets filled at [facility].      . Probiotic Product (ALIGN PO) Take 1 tablet by mouth daily.      . valACYclovir (VALTREX) 500 MG tablet Take 500 mg by mouth 2 (two) times daily as needed (out breaks).       No current facility-administered medications on file prior to visit.      Review of Systems    see HPi Objective:   Physical Exam  Physical Exam  Nursing note and vitals reviewed.  Constitutional: She is oriented to person, place, and time. She appears well-developed and well-nourished.  HENT:  Head: Normocephalic and atraumatic.  Cardiovascular: Normal rate and regular rhythm. Exam reveals no gallop and no friction rub.  No murmur heard.  Pulmonary/Chest: Breath sounds normal. She has no wheezes. She has no rales.  Neurological: She is alert and oriented to person, place, and time.  Skin: Skin is  warm and dry. M/S  Healing laceration left thumb.    Psychiatric: She has a normal mood and affect. Her behavior is normal.             Assessment & Plan:  Thumb pain  I explained to pt that the superficial skin nerves are regenerating.    Ok to use Motrin 800 mg at night

## 2014-05-25 NOTE — Patient Instructions (Signed)
Call if any problems

## 2014-05-30 ENCOUNTER — Encounter (HOSPITAL_COMMUNITY): Payer: Self-pay | Admitting: Emergency Medicine

## 2014-05-30 ENCOUNTER — Emergency Department (HOSPITAL_COMMUNITY)
Admission: EM | Admit: 2014-05-30 | Discharge: 2014-05-30 | Disposition: A | Discharge status: Home / Self Care | Payer: BC Managed Care – PPO | Attending: Emergency Medicine | Admitting: Emergency Medicine

## 2014-05-30 ENCOUNTER — Emergency Department (HOSPITAL_COMMUNITY): Payer: BC Managed Care – PPO

## 2014-05-30 DIAGNOSIS — Z79899 Other long term (current) drug therapy: Secondary | ICD-10-CM | POA: Insufficient documentation

## 2014-05-30 DIAGNOSIS — G44209 Tension-type headache, unspecified, not intractable: Secondary | ICD-10-CM

## 2014-05-30 DIAGNOSIS — Z8742 Personal history of other diseases of the female genital tract: Secondary | ICD-10-CM | POA: Insufficient documentation

## 2014-05-30 DIAGNOSIS — Z8589 Personal history of malignant neoplasm of other organs and systems: Secondary | ICD-10-CM | POA: Insufficient documentation

## 2014-05-30 DIAGNOSIS — IMO0002 Reserved for concepts with insufficient information to code with codable children: Secondary | ICD-10-CM | POA: Insufficient documentation

## 2014-05-30 DIAGNOSIS — Z3202 Encounter for pregnancy test, result negative: Secondary | ICD-10-CM | POA: Insufficient documentation

## 2014-05-30 DIAGNOSIS — Z8679 Personal history of other diseases of the circulatory system: Secondary | ICD-10-CM | POA: Insufficient documentation

## 2014-05-30 DIAGNOSIS — Z792 Long term (current) use of antibiotics: Secondary | ICD-10-CM | POA: Insufficient documentation

## 2014-05-30 LAB — URINALYSIS, ROUTINE W REFLEX MICROSCOPIC
Bilirubin Urine: NEGATIVE
Glucose, UA: NEGATIVE mg/dL
Hgb urine dipstick: NEGATIVE
Ketones, ur: NEGATIVE mg/dL
LEUKOCYTES UA: NEGATIVE
Nitrite: NEGATIVE
PH: 6.5 (ref 5.0–8.0)
PROTEIN: NEGATIVE mg/dL
Specific Gravity, Urine: 1.013 (ref 1.005–1.030)
Urobilinogen, UA: 0.2 mg/dL (ref 0.0–1.0)

## 2014-05-30 LAB — CBC WITH DIFFERENTIAL/PLATELET
BASOS ABS: 0 10*3/uL (ref 0.0–0.1)
Basophils Relative: 0 % (ref 0–1)
Eosinophils Absolute: 0.2 10*3/uL (ref 0.0–0.7)
Eosinophils Relative: 3 % (ref 0–5)
HCT: 35.4 % — ABNORMAL LOW (ref 36.0–46.0)
Hemoglobin: 11.4 g/dL — ABNORMAL LOW (ref 12.0–15.0)
LYMPHS PCT: 39 % (ref 12–46)
Lymphs Abs: 2.4 10*3/uL (ref 0.7–4.0)
MCH: 30.6 pg (ref 26.0–34.0)
MCHC: 32.2 g/dL (ref 30.0–36.0)
MCV: 94.9 fL (ref 78.0–100.0)
Monocytes Absolute: 0.4 10*3/uL (ref 0.1–1.0)
Monocytes Relative: 7 % (ref 3–12)
NEUTROS ABS: 3.2 10*3/uL (ref 1.7–7.7)
Neutrophils Relative %: 51 % (ref 43–77)
Platelets: 200 10*3/uL (ref 150–400)
RBC: 3.73 MIL/uL — ABNORMAL LOW (ref 3.87–5.11)
RDW: 12.9 % (ref 11.5–15.5)
WBC: 6.2 10*3/uL (ref 4.0–10.5)

## 2014-05-30 LAB — I-STAT CHEM 8, ED
BUN: 35 mg/dL — ABNORMAL HIGH (ref 6–23)
CHLORIDE: 101 meq/L (ref 96–112)
Calcium, Ion: 1.19 mmol/L (ref 1.12–1.23)
Creatinine, Ser: 1.2 mg/dL — ABNORMAL HIGH (ref 0.50–1.10)
Glucose, Bld: 84 mg/dL (ref 70–99)
HCT: 35 % — ABNORMAL LOW (ref 36.0–46.0)
Hemoglobin: 11.9 g/dL — ABNORMAL LOW (ref 12.0–15.0)
Potassium: 4.1 mEq/L (ref 3.7–5.3)
SODIUM: 137 meq/L (ref 137–147)
TCO2: 24 mmol/L (ref 0–100)

## 2014-05-30 LAB — POC URINE PREG, ED: PREG TEST UR: NEGATIVE

## 2014-05-30 MED ORDER — KETOROLAC TROMETHAMINE 60 MG/2ML IM SOLN
60.0000 mg | Freq: Once | INTRAMUSCULAR | Status: AC
  Administered 2014-05-30: 60 mg via INTRAMUSCULAR
  Filled 2014-05-30: qty 2

## 2014-05-30 NOTE — ED Notes (Signed)
Patient presents stating that she started with a headache on Thursday and then Friday she started with blurred vision .  Denies weakness in ext.  Has headaches but not like this

## 2014-05-30 NOTE — ED Notes (Signed)
Patient transported to CT 

## 2014-05-30 NOTE — ED Provider Notes (Signed)
CSN: [id number]     Arrival date & time 05/30/14  2012 History   First MD Initiated Contact with Patient 05/30/14 2120     Chief Complaint  Patient presents with  . Headache  . Blurred Vision     (Consider location/radiation/quality/duration/timing/severity/associated sxs/prior Treatment) HPI Terri Bailey is a 43 y.o. female who presents to emergency department complaining of a headache. Patient states her headache began 2 days ago, states it was gradual in onset. States headache is constant, dull, but sparing intensity. Nothing is making her headache better or worse. She states that headache is across the for head and front of the head. She reports intermittent blurred vision. She denies any numbness or weakness in extremities. She denies any fever, chills, neck pain or stiffness. She denies any nausea, vomiting. She does admit to photophobia. She denies history of headaches or migraines. She states she does have really painful menstrual cycles and normally takes oral Toradol with onset. Patient states that she has been taking Toradol yesterday and today, 10 mg tablet once a day. She states that she began spotting yesterday. She states she's unsure if she's pregnant, but believes that she is not because she began her period yesterday. Patient denies any related medical problems. She states she has not been sleeping well, but reports this is normal for her. She states she sleeps 4-6 hours a night. She also states she drinks at least one caffeinated beverage a day, states usually at work, but she has not been at work this week and has not had any. She states she has been eating and drinking plenty of fluids. She denies any head injuries. She has no other complaints.  Past Medical History  Diagnosis Date  . IBS (irritable bowel syndrome)   . Sinus tachycardia   . Fibroid   . Constipation, chronic   . Ovarian cyst    Past Surgical History  Procedure Laterality Date  . Ovarian cyst surgery     . Uterine fibroid surgery     Family History  Problem Relation Age of Onset  . Diabetes Mother   . Hypertension Mother   . Hyperlipidemia Mother   . Heart disease Father   . Heart attack Father   . Sudden death Father   . Cancer Maternal Grandmother   . Stroke Maternal Grandmother   . Hypertension Paternal Grandmother   . Hypertension Paternal Grandfather    History  Substance Use Topics  . Smoking status: Never Smoker   . Smokeless tobacco: Never Used  . Alcohol Use: 0.0 oz/week    0 Glasses of wine per week     Comment: socially   OB History   Grav Para Term Preterm Abortions TAB SAB Ect Mult Living   0 0 0 0 0 0 0 0 0 0      Review of Systems  Constitutional: Negative for fever and chills.  Eyes: Positive for photophobia and visual disturbance. Negative for pain.  Respiratory: Negative for cough, chest tightness and shortness of breath.   Cardiovascular: Negative for chest pain, palpitations and leg swelling.  Gastrointestinal: Negative for nausea, vomiting, abdominal pain and diarrhea.  Genitourinary: Negative for dysuria, flank pain, vaginal bleeding, vaginal discharge, vaginal pain and pelvic pain.  Musculoskeletal: Negative for arthralgias, back pain, myalgias, neck pain and neck stiffness.  Skin: Negative for rash.  Neurological: Positive for headaches. Negative for dizziness and weakness.  All other systems reviewed and are negative.     Allergies  Review of patient's allergies  indicates no known allergies.  Home Medications   Prior to Admission medications   Medication Sig Start Date End Date Taking? Authorizing Provider  BIOTIN PO Take 1 tablet by mouth daily.   Yes Historical Provider, MD  CALCIUM PO Take 1 tablet by mouth daily.   Yes Historical Provider, MD  clobetasol (OLUX) 0.05 % topical foam Apply 1 application topically daily.    Yes Historical Provider, MD  desonide (DESOWEN) 0.05 % cream Apply 1 application topically daily as needed (for  rash).    Yes Historical Provider, MD  Flaxseed, Linseed, (FLAX SEED OIL PO) Take 1 tablet by mouth daily.   Yes Historical Provider, MD  GLUCOSAMINE PO Take 2 tablets by mouth daily.   Yes Historical Provider, MD  ketorolac (TORADOL) 10 MG tablet Take 1 tablet (10 mg total) by mouth every 6 (six) hours as needed for pain. 01/29/13  Yes Alwyn Pea, MD  minocycline (MINOCIN,DYNACIN) 50 MG capsule Take 50 mg by mouth 2 (two) times daily.  05/20/14  Yes Historical Provider, MD  Multiple Vitamin (MULTIVITAMIN WITH MINERALS) TABS Take 2 tablets by mouth daily.   Yes Historical Provider, MD  Omega-3 Fatty Acids (FISH OIL PO) Take 2 capsules by mouth daily.   Yes Historical Provider, MD  OVER THE COUNTER MEDICATION Take 1 packet by mouth daily. Emergency Vitamin C   Yes Historical Provider, MD  polyethylene glycol (MIRALAX / GLYCOLAX) packet Take 17 g by mouth 2 (two) times daily.   Yes Historical Provider, MD  Probiotic Product (PROBIOTIC DAILY) CAPS Take 1 tablet by mouth daily.   Yes Historical Provider, MD  valACYclovir (VALTREX) 500 MG tablet Take 500 mg by mouth 2 (two) times daily as needed (out breaks).   Yes Historical Provider, MD   BP 97/65  Pulse 73  Temp(Src) 98.4 F (36.9 C) (Oral)  Resp 13  Wt 129 lb 4 oz (58.627 kg)  SpO2 99%  LMP 04/27/2014 Physical Exam  Nursing note and vitals reviewed. Constitutional: She is oriented to person, place, and time. She appears well-developed and well-nourished. No distress.  HENT:  Head: Normocephalic and atraumatic.  Right Ear: External ear normal.  Nose: Nose normal.  Mouth/Throat: Oropharynx is clear and moist.  Eyes: Conjunctivae and EOM are normal. Pupils are equal, round, and reactive to light.  Neck: Normal range of motion. Neck supple.  Cardiovascular: Normal rate, regular rhythm and normal heart sounds.   Pulmonary/Chest: Effort normal and breath sounds normal. No respiratory distress. She has no wheezes. She has no rales.   Abdominal: Soft. Bowel sounds are normal. She exhibits no distension. There is no tenderness. There is no rebound.  Musculoskeletal: She exhibits no edema.  Neurological: She is alert and oriented to person, place, and time. No cranial nerve deficit. Coordination normal.  5/5 and equal upper and lower extremity strength bilaterally. Equal grip strength bilaterally. Normal finger to nose and heel to shin. No pronator drift. Gait is normal  Skin: Skin is warm and dry.  Psychiatric: She has a normal mood and affect. Her behavior is normal.    ED Course  Procedures (including critical care time) Labs Review Labs Reviewed  CBC WITH DIFFERENTIAL - Abnormal; Notable for the following:    RBC 3.73 (*)    Hemoglobin 11.4 (*)    HCT 35.4 (*)    All other components within normal limits  I-STAT CHEM 8, ED - Abnormal; Notable for the following:    BUN 35 (*)  Creatinine, Ser 1.20 (*)    Hemoglobin 11.9 (*)    HCT 35.0 (*)    All other components within normal limits  URINALYSIS, ROUTINE W REFLEX MICROSCOPIC  POC URINE PREG, ED    Imaging Review Ct Head Wo Contrast  05/30/2014   CLINICAL DATA:  Headache and blurred vision.  EXAM: CT HEAD WITHOUT CONTRAST  TECHNIQUE: Contiguous axial images were obtained from the base of the skull through the vertex without intravenous contrast.  COMPARISON:  None.  FINDINGS: Skull and Sinuses:Negative for fracture or destructive process. The mastoids, middle ears, and imaged paranasal sinuses are clear.  Orbits: No acute abnormality.  Brain: No evidence of acute abnormality, such as acute infarction, hemorrhage, hydrocephalus, or mass lesion/mass effect.  IMPRESSION: Negative head CT.   Electronically Signed   By: Jorje Guild M.D.   On: 05/30/2014 22:15     EKG Interpretation None      MDM   Final diagnoses:  Tension-type headache, not intractable, unspecified chronicity pattern    Patient is here with gradual onset of headache 2 days ago. She  has normal neurological exam. She's afebrile. No neck pain or stiffness, no signs or evidence of meningitis. No head trauma. Patient is nontoxic appearing, no acute distress. Normal vital signs. Will try caffeine and Toradol for her headache. She drove here unable to give her any other sedating medications at this time. Will check CBC, metabolic panel for anemia and electrolyte abnormalities, will get CT head, patient is very anxious and is concerned about possible intracranial abnormality.  11:25 PM Patient's CTs. I do not think she has intracranial bleeding, given gradual onset of headache, pain is currently only 4/10, normal neurological exam. The pain did improve with Toradol and caffeinated beverage. Patient's blood work did show elevated creatinine and BUN, she did admit to me that she has not been drinking as much fluids, she's been on vacation this week, and her diet has not been as good. Suspect maybe this headache is partially due to dehydration. Will discharge home with Excedrin Migraine, continue by mouth Toradol at home follow with primary care Dr. Encouraged fluids.   Filed Vitals:   05/30/14 2245 05/30/14 2246 05/30/14 2300 05/30/14 2315  BP: 105/77 105/77 104/75 104/75  Pulse: 64 67 65 61  Temp:      TempSrc:      Resp: 18 21 18 16   Weight:      SpO2: 100% 100% 99% 100%       Florene Route Lanise Mergen, PA-C 05/30/14 2328

## 2014-05-30 NOTE — Discharge Instructions (Signed)
Take Excedrin migraine for pain. Continue toradol. Follow up with primary care doctor return if any new concerning symptoms.    Tension Headache A tension headache is a feeling of pain, pressure, or aching often felt over the front and sides of the head. The pain can be dull or can feel tight (constricting). It is the most common type of headache. Tension headaches are not normally associated with nausea or vomiting and do not get worse with physical activity. Tension headaches can last 30 minutes to several days.  CAUSES  The exact cause is not known, but it may be caused by chemicals and hormones in the brain that lead to pain. Tension headaches often begin after stress, anxiety, or depression. Other triggers may include:  Alcohol.  Caffeine (too much or withdrawal).  Respiratory infections (colds, flu, sinus infections).  Dental problems or teeth clenching.  Fatigue.  Holding your head and neck in one position too long while using a computer. SYMPTOMS   Pressure around the head.   Dull, aching head pain.   Pain felt over the front and sides of the head.   Tenderness in the muscles of the head, neck, and shoulders. DIAGNOSIS  A tension headache is often diagnosed based on:   Symptoms.   Physical examination.   A CT scan or MRI of your head. These tests may be ordered if symptoms are severe or unusual. TREATMENT  Medicines may be given to help relieve symptoms.  HOME CARE INSTRUCTIONS   Only take over-the-counter or prescription medicines for pain or discomfort as directed by your caregiver.   Lie down in a dark, quiet room when you have a headache.   Keep a journal to find out what may be triggering your headaches. For example, write down:  What you eat and drink.  How much sleep you get.  Any change to your diet or medicines.  Try massage or other relaxation techniques.   Ice packs or heat applied to the head and neck can be used. Use these 3 to 4  times per day for 15 to 20 minutes each time, or as needed.   Limit stress.   Sit up straight, and do not tense your muscles.   Quit smoking if you smoke.  Limit alcohol use.  Decrease the amount of caffeine you drink, or stop drinking caffeine.  Eat and exercise regularly.  Get 7 to 9 hours of sleep, or as recommended by your caregiver.  Avoid excessive use of pain medicine as recurrent headaches can occur.  SEEK MEDICAL CARE IF:   You have problems with the medicines you were prescribed.  Your medicines do not work.  You have a change from the usual headache.  You have nausea or vomiting. SEEK IMMEDIATE MEDICAL CARE IF:   Your headache becomes severe.  You have a fever.  You have a stiff neck.  You have loss of vision.  You have muscular weakness or loss of muscle control.  You lose your balance or have trouble walking.  You feel faint or pass out.  You have severe symptoms that are different from your first symptoms. MAKE SURE YOU:   Understand these instructions.  Will watch your condition.  Will get help right away if you are not doing well or get worse. Document Released: 11/13/2005 Document Revised: 02/05/2012 Document Reviewed: 11/03/2011 [facility] Patient Information 2015 [place], [place]. This information is not intended to replace advice given to you by your health care provider. Make sure you discuss  any questions you have with your health care provider. ° °

## 2014-05-31 NOTE — ED Provider Notes (Signed)
Medical screening examination/treatment/procedure(s) were performed by non-physician practitioner and as supervising physician I was immediately available for consultation/collaboration.   EKG Interpretation None        Neta Ehlers, MD 05/31/14 1209

## 2014-06-17 ENCOUNTER — Ambulatory Visit (INDEPENDENT_AMBULATORY_CARE_PROVIDER_SITE_OTHER): Payer: BC Managed Care – PPO | Admitting: Sports Medicine

## 2014-06-17 ENCOUNTER — Encounter: Payer: Self-pay | Admitting: Sports Medicine

## 2014-06-17 VITALS — BP 113/78 | Ht 64.0 in | Wt 123.0 lb

## 2014-06-17 DIAGNOSIS — M25571 Pain in right ankle and joints of right foot: Secondary | ICD-10-CM | POA: Insufficient documentation

## 2014-06-17 DIAGNOSIS — M25579 Pain in unspecified ankle and joints of unspecified foot: Secondary | ICD-10-CM

## 2014-06-17 DIAGNOSIS — M222X1 Patellofemoral disorders, right knee: Secondary | ICD-10-CM | POA: Insufficient documentation

## 2014-06-17 DIAGNOSIS — M25569 Pain in unspecified knee: Secondary | ICD-10-CM

## 2014-06-17 NOTE — Assessment & Plan Note (Addendum)
Complicated by weak hip abductors, internally rotated hips, femoral anteversion, and pes planus -We added a medial heel lift today to improve biomechanics -An order for physical therapy was given today to assist with more aggressive hip strengthening -Modify exercises to include hip strengthening, and try to limit or avoid stair climbing at this point. Biking will be less of a strain on your knees and ankle for right now -You may take Aleve 500mg  twice daily with food for the next 7-10 days to reduce inflammation. -Plan for follow-up in 4 weeks after the physical therapy

## 2014-06-17 NOTE — Patient Instructions (Signed)
-  We added a medial heel lift today to your shoes to improve your biomechanics -An order for physical therapy was given today -Modify exercises to include hip strengthening, and try to limit or avoid stair climbing at this point. Biking will be less of a strain on your knees and ankle for right now -You may take Aleve 500mg  twice daily with food for the next 7-10 days to reduce inflammation. -Plan for follow-up in 4 weeks after the physical therapy

## 2014-06-17 NOTE — Progress Notes (Signed)
   Subjective:    Patient ID: Terri Bailey, female    DOB: 10/01/1971, 43 y.o.   MRN: [id number]  HPI Patient presents today for followup of bilateral knee pain and for new complaint of right lateral ankle pain. Her right ankle pain started approximately one month ago, without any known acute injury. She has modified her activities due to bilateral knee pain to include more stairclimbing as posterior running. Ankle pain is aggravated with activity, and relieved with rest. She is wearing her custom orthotics and knee sleeve, which provided some relief. She is not taking any medications for this problem. She denies any ankle or knee swelling. She is somewhat frustrated, because she has not been able to run due to her knee and ankle pain. She is otherwise in a good state of health.  Past medical, social, medications, and allergies were reviewed by me today and are otherwise noncontributory.  Review of Systems Pertinent positives: Right lateral ankle pain, right greater than left knee pain Pertinent negatives: Swelling, bruising, giving out, rash, fevers, chills, malaise, numbness, tingling, weakness 11 point review of systems was performed is otherwise negative.    Objective:   Physical Exam BP 113/78  Ht 5\' 4"  (1.626 m)  Wt 123 lb (55.792 kg)  BMI 21.10 kg/m2  LMP 04/27/2014 GEN: The patient is well-developed well-nourished female and in no acute distress.  She is awake alert and oriented x3. SKIN: warm and well-perfused, no rash  EXTR: No lower extremity edema or calf tenderness Neuro: Strength 5/5 globally. Sensation intact throughout. DTRs 2/4 bilaterally. No focal deficits. Vasc: +2 bilateral distal pulses. MSK: Examination of the lower extremities reveals the following: Hip: Full active range of motion bilateral hips. And atrial hips are slightly held in internal rotation. Weak bilateral, though right greater than left hip abductors with single leg squat testing. Good tone at the  quadriceps muscles bilaterally, though right VMO is not as robust as compared to the left VMO.  Knees: Examination of the bilateral knees reveals no swelling, erythema, or induration. Positive patellar grind test right greater than left. Negative Lachman's bilaterally. Negative McMurray's. Negative log roll test.  Ankle/Foot: Examination of the superior ankle and distal fibula reveals mild tenderness to palpation in the area of the distal peroneal tendons approximately 5-10 cm proximal to the lateral malleolus. No bony tenderness. Negative hop test. Strength intact with foot abduction. Examination of the ankle reveals no swelling, bony tenderness, or deformity. Negative calcaneal squeeze test. No tenderness at the Achilles tendon insertion. Negative talar tilt test. In the right foot she has second metatarsal insufficiency. Bilateral pes planus deformity. Gait Analysis: The patient ambulates the valgus deformity at the knees is noted bilaterally. She also has left greater than right calcaneal valgus deformity. She has little to no pronation at the ankles with walking or running. Mechanics were correctable slightly with addition of a medial heel lift pad.    Assessment & Plan:  See problem based assessment and plan at the problem list.

## 2014-06-17 NOTE — Assessment & Plan Note (Signed)
Primarily etiology is altered biomechanics, but aggravated by stair climbing for exercise. -We added a medial heel lift today to to improve biomechanics -An order for physical therapy was given today -Modify exercises to include hip strengthening, and try to limit or avoid stair climbing at this point. Biking will be less of a strain on your knees and ankle for right now -You may take Aleve 500mg  twice daily with food for the next 7-10 days to reduce inflammation. -Plan for follow-up in 4 weeks after the physical therapy

## 2014-06-23 ENCOUNTER — Ambulatory Visit: Payer: BC Managed Care – PPO | Attending: Sports Medicine

## 2014-06-23 DIAGNOSIS — IMO0001 Reserved for inherently not codable concepts without codable children: Secondary | ICD-10-CM | POA: Insufficient documentation

## 2014-06-23 DIAGNOSIS — M2560 Stiffness of unspecified joint, not elsewhere classified: Secondary | ICD-10-CM | POA: Diagnosis not present

## 2014-06-23 DIAGNOSIS — M25579 Pain in unspecified ankle and joints of unspecified foot: Secondary | ICD-10-CM | POA: Diagnosis not present

## 2014-06-23 DIAGNOSIS — M25569 Pain in unspecified knee: Secondary | ICD-10-CM | POA: Insufficient documentation

## 2014-06-23 DIAGNOSIS — R5381 Other malaise: Secondary | ICD-10-CM | POA: Diagnosis not present

## 2014-06-24 ENCOUNTER — Other Ambulatory Visit: Payer: Self-pay | Admitting: *Deleted

## 2014-06-24 ENCOUNTER — Telehealth: Payer: Self-pay | Admitting: *Deleted

## 2014-06-24 NOTE — Telephone Encounter (Signed)
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

## 2014-06-24 NOTE — Telephone Encounter (Signed)
Pt called in requesting refill on Valtrex

## 2014-06-25 MED ORDER — VALACYCLOVIR HCL 500 MG PO TABS: 500.0000 mg | ORAL_TABLET | Freq: Two times a day (BID) | ORAL | Status: DC | PRN

## 2014-06-29 ENCOUNTER — Ambulatory Visit: Payer: BC Managed Care – PPO | Attending: Sports Medicine | Admitting: Rehabilitation

## 2014-06-29 DIAGNOSIS — M25569 Pain in unspecified knee: Secondary | ICD-10-CM | POA: Diagnosis not present

## 2014-06-29 DIAGNOSIS — M25579 Pain in unspecified ankle and joints of unspecified foot: Secondary | ICD-10-CM | POA: Diagnosis not present

## 2014-06-29 DIAGNOSIS — R5381 Other malaise: Secondary | ICD-10-CM | POA: Insufficient documentation

## 2014-06-29 DIAGNOSIS — M2560 Stiffness of unspecified joint, not elsewhere classified: Secondary | ICD-10-CM | POA: Diagnosis not present

## 2014-06-29 DIAGNOSIS — IMO0001 Reserved for inherently not codable concepts without codable children: Secondary | ICD-10-CM | POA: Diagnosis not present

## 2014-07-07 ENCOUNTER — Ambulatory Visit: Payer: BC Managed Care – PPO | Admitting: Physical Therapy

## 2014-07-09 ENCOUNTER — Encounter: Payer: BC Managed Care – PPO | Admitting: Physical Therapy

## 2014-07-14 ENCOUNTER — Ambulatory Visit: Payer: BC Managed Care – PPO | Admitting: Physical Therapy

## 2014-07-14 DIAGNOSIS — IMO0001 Reserved for inherently not codable concepts without codable children: Secondary | ICD-10-CM | POA: Diagnosis not present

## 2014-07-16 ENCOUNTER — Ambulatory Visit: Payer: BC Managed Care – PPO | Admitting: Physical Therapy

## 2014-07-16 DIAGNOSIS — IMO0001 Reserved for inherently not codable concepts without codable children: Secondary | ICD-10-CM | POA: Diagnosis not present

## 2014-07-22 ENCOUNTER — Encounter: Payer: BC Managed Care – PPO | Admitting: Physical Therapy

## 2014-07-28 ENCOUNTER — Ambulatory Visit: Payer: BC Managed Care – PPO | Attending: Internal Medicine | Admitting: Physical Therapy

## 2014-07-28 DIAGNOSIS — M2560 Stiffness of unspecified joint, not elsewhere classified: Secondary | ICD-10-CM | POA: Diagnosis not present

## 2014-07-28 DIAGNOSIS — M25579 Pain in unspecified ankle and joints of unspecified foot: Secondary | ICD-10-CM | POA: Insufficient documentation

## 2014-07-28 DIAGNOSIS — M25569 Pain in unspecified knee: Secondary | ICD-10-CM | POA: Insufficient documentation

## 2014-07-28 DIAGNOSIS — IMO0001 Reserved for inherently not codable concepts without codable children: Secondary | ICD-10-CM | POA: Diagnosis present

## 2014-07-28 DIAGNOSIS — R5381 Other malaise: Secondary | ICD-10-CM | POA: Insufficient documentation

## 2014-07-30 ENCOUNTER — Ambulatory Visit: Payer: BC Managed Care – PPO | Admitting: Rehabilitation

## 2014-07-30 ENCOUNTER — Other Ambulatory Visit: Payer: Self-pay | Admitting: *Deleted

## 2014-07-30 DIAGNOSIS — IMO0001 Reserved for inherently not codable concepts without codable children: Secondary | ICD-10-CM | POA: Diagnosis not present

## 2014-07-30 MED ORDER — ALPRAZOLAM 0.5 MG PO TABS: 0.5000 mg | ORAL_TABLET | ORAL | Status: DC | PRN

## 2014-07-30 NOTE — Telephone Encounter (Signed)
Pt called in needing refill on xanax

## 2014-08-05 ENCOUNTER — Ambulatory Visit: Payer: BC Managed Care – PPO | Admitting: Rehabilitation

## 2014-08-05 DIAGNOSIS — IMO0001 Reserved for inherently not codable concepts without codable children: Secondary | ICD-10-CM | POA: Diagnosis not present

## 2014-08-07 ENCOUNTER — Ambulatory Visit: Payer: BC Managed Care – PPO | Admitting: Physical Therapy

## 2014-08-07 DIAGNOSIS — IMO0001 Reserved for inherently not codable concepts without codable children: Secondary | ICD-10-CM | POA: Diagnosis not present

## 2014-08-10 ENCOUNTER — Other Ambulatory Visit: Payer: Self-pay | Admitting: Internal Medicine

## 2014-08-10 DIAGNOSIS — Z1231 Encounter for screening mammogram for malignant neoplasm of breast: Secondary | ICD-10-CM

## 2014-08-11 ENCOUNTER — Ambulatory Visit: Payer: BC Managed Care – PPO | Admitting: Physical Therapy

## 2014-08-11 DIAGNOSIS — IMO0001 Reserved for inherently not codable concepts without codable children: Secondary | ICD-10-CM | POA: Diagnosis not present

## 2014-08-14 ENCOUNTER — Encounter: Payer: BC Managed Care – PPO | Admitting: Physical Therapy

## 2014-08-17 ENCOUNTER — Encounter (HOSPITAL_COMMUNITY): Payer: Self-pay | Admitting: Emergency Medicine

## 2014-08-17 ENCOUNTER — Observation Stay (HOSPITAL_COMMUNITY)
Admission: EM | Admit: 2014-08-17 | Discharge: 2014-08-18 | Disposition: A | Discharge status: Home / Self Care | Payer: BC Managed Care – PPO | Attending: Internal Medicine | Admitting: Internal Medicine

## 2014-08-17 DIAGNOSIS — I471 Supraventricular tachycardia, unspecified: Secondary | ICD-10-CM | POA: Diagnosis present

## 2014-08-17 DIAGNOSIS — R079 Chest pain, unspecified: Secondary | ICD-10-CM | POA: Diagnosis present

## 2014-08-17 DIAGNOSIS — E785 Hyperlipidemia, unspecified: Secondary | ICD-10-CM | POA: Diagnosis present

## 2014-08-17 DIAGNOSIS — I498 Other specified cardiac arrhythmias: Secondary | ICD-10-CM | POA: Diagnosis not present

## 2014-08-17 DIAGNOSIS — N179 Acute kidney failure, unspecified: Secondary | ICD-10-CM | POA: Diagnosis not present

## 2014-08-17 DIAGNOSIS — K589 Irritable bowel syndrome without diarrhea: Secondary | ICD-10-CM | POA: Insufficient documentation

## 2014-08-17 DIAGNOSIS — R0789 Other chest pain: Principal | ICD-10-CM | POA: Insufficient documentation

## 2014-08-17 LAB — BASIC METABOLIC PANEL
ANION GAP: 11 (ref 5–15)
BUN: 30 mg/dL — ABNORMAL HIGH (ref 6–23)
CO2: 27 mEq/L (ref 19–32)
Calcium: 9.7 mg/dL (ref 8.4–10.5)
Chloride: 96 mEq/L (ref 96–112)
Creatinine, Ser: 1.02 mg/dL (ref 0.50–1.10)
GFR calc Af Amer: 76 mL/min — ABNORMAL LOW (ref >90)
GFR, EST NON AFRICAN AMERICAN: 66 mL/min — AB (ref >90)
GLUCOSE: 101 mg/dL — AB (ref 70–99)
POTASSIUM: 4.5 meq/L (ref 3.7–5.3)
SODIUM: 134 meq/L — AB (ref 137–147)

## 2014-08-17 LAB — CBC
HCT: 38.7 % (ref 36.0–46.0)
Hemoglobin: 12.9 g/dL (ref 12.0–15.0)
MCH: 31.2 pg (ref 26.0–34.0)
MCHC: 33.3 g/dL (ref 30.0–36.0)
MCV: 93.7 fL (ref 78.0–100.0)
Platelets: 199 10*3/uL (ref 150–400)
RBC: 4.13 MIL/uL (ref 3.87–5.11)
RDW: 12.9 % (ref 11.5–15.5)
WBC: 6.8 10*3/uL (ref 4.0–10.5)

## 2014-08-17 LAB — I-STAT TROPONIN, ED: TROPONIN I, POC: 0 ng/mL (ref 0.00–0.08)

## 2014-08-17 NOTE — ED Notes (Signed)
Pt presents with c/o left side chest pain, burning in nature. Pt reports that the pain started approx one hour ago and this is the third time she has felt this pain in the last four days. Pt ambulatory to triage, NAD at this time. No shortness of breath reported by patient.

## 2014-08-18 ENCOUNTER — Ambulatory Visit (HOSPITAL_COMMUNITY)
Admission: RE | Admit: 2014-08-18 | Discharge: 2014-08-18 | Disposition: A | Discharge status: Home / Self Care | Payer: BC Managed Care – PPO | Source: Ambulatory Visit | Attending: Cardiology | Admitting: Cardiology

## 2014-08-18 ENCOUNTER — Encounter (HOSPITAL_COMMUNITY): Payer: Self-pay | Admitting: *Deleted

## 2014-08-18 ENCOUNTER — Encounter: Payer: BC Managed Care – PPO | Admitting: Physical Therapy

## 2014-08-18 ENCOUNTER — Emergency Department (HOSPITAL_COMMUNITY): Payer: BC Managed Care – PPO

## 2014-08-18 ENCOUNTER — Ambulatory Visit (HOSPITAL_COMMUNITY): Payer: BC Managed Care – PPO

## 2014-08-18 DIAGNOSIS — I498 Other specified cardiac arrhythmias: Secondary | ICD-10-CM

## 2014-08-18 DIAGNOSIS — R079 Chest pain, unspecified: Secondary | ICD-10-CM | POA: Diagnosis present

## 2014-08-18 DIAGNOSIS — R072 Precordial pain: Secondary | ICD-10-CM

## 2014-08-18 LAB — TROPONIN I
Troponin I: <0.3 ng/mL (ref <0.30)
Troponin I: <0.3 ng/mL (ref <0.30)
Troponin I: <0.3 ng/mL (ref <0.30)

## 2014-08-18 LAB — COMPREHENSIVE METABOLIC PANEL
ALBUMIN: 3.3 g/dL — AB (ref 3.5–5.2)
ALT: 30 U/L (ref 0–35)
AST: 30 U/L (ref 0–37)
Alkaline Phosphatase: 53 U/L (ref 39–117)
Anion gap: 11 (ref 5–15)
BUN: 24 mg/dL — ABNORMAL HIGH (ref 6–23)
CALCIUM: 8.9 mg/dL (ref 8.4–10.5)
CO2: 23 meq/L (ref 19–32)
CREATININE: 1.18 mg/dL — AB (ref 0.50–1.10)
Chloride: 105 mEq/L (ref 96–112)
GFR calc Af Amer: 64 mL/min — ABNORMAL LOW (ref >90)
GFR, EST NON AFRICAN AMERICAN: 55 mL/min — AB (ref >90)
Glucose, Bld: 103 mg/dL — ABNORMAL HIGH (ref 70–99)
Potassium: 4.1 mEq/L (ref 3.7–5.3)
SODIUM: 139 meq/L (ref 137–147)
Total Bilirubin: 0.3 mg/dL (ref 0.3–1.2)
Total Protein: 6.4 g/dL (ref 6.0–8.3)

## 2014-08-18 LAB — CBC WITH DIFFERENTIAL/PLATELET
BASOS ABS: 0 10*3/uL (ref 0.0–0.1)
BASOS PCT: 0 % (ref 0–1)
EOS PCT: 3 % (ref 0–5)
Eosinophils Absolute: 0.2 10*3/uL (ref 0.0–0.7)
HCT: 35.4 % — ABNORMAL LOW (ref 36.0–46.0)
HEMOGLOBIN: 11.6 g/dL — AB (ref 12.0–15.0)
LYMPHS PCT: 36 % (ref 12–46)
Lymphs Abs: 1.8 10*3/uL (ref 0.7–4.0)
MCH: 30.7 pg (ref 26.0–34.0)
MCHC: 32.8 g/dL (ref 30.0–36.0)
MCV: 93.7 fL (ref 78.0–100.0)
Monocytes Absolute: 0.4 10*3/uL (ref 0.1–1.0)
Monocytes Relative: 9 % (ref 3–12)
Neutro Abs: 2.6 10*3/uL (ref 1.7–7.7)
Neutrophils Relative %: 52 % (ref 43–77)
PLATELETS: 181 10*3/uL (ref 150–400)
RBC: 3.78 MIL/uL — AB (ref 3.87–5.11)
RDW: 12.9 % (ref 11.5–15.5)
WBC: 5 10*3/uL (ref 4.0–10.5)

## 2014-08-18 LAB — HCG, SERUM, QUALITATIVE: Preg, Serum: NEGATIVE

## 2014-08-18 LAB — PROTIME-INR
INR: 0.99 (ref 0.00–1.49)
Prothrombin Time: 13.1 seconds (ref 11.6–15.2)

## 2014-08-18 LAB — D-DIMER, QUANTITATIVE: D-Dimer, Quant: 0.64 ug/mL-FEU — ABNORMAL HIGH (ref 0.00–0.48)

## 2014-08-18 MED ORDER — ONDANSETRON HCL 4 MG/2ML IJ SOLN: 4.0000 mg | Freq: Four times a day (QID) | INTRAMUSCULAR | Status: DC | PRN

## 2014-08-18 MED ORDER — ENOXAPARIN SODIUM 40 MG/0.4ML ~~LOC~~ SOLN
40.0000 mg | SUBCUTANEOUS | Status: DC
  Administered 2014-08-18: 40 mg via SUBCUTANEOUS
  Filled 2014-08-18 (×2): qty 0.4

## 2014-08-18 MED ORDER — ALPRAZOLAM 0.5 MG PO TABS: 0.5000 mg | ORAL_TABLET | Freq: Two times a day (BID) | ORAL | Status: DC | PRN

## 2014-08-18 MED ORDER — ASPIRIN 81 MG PO CHEW
324.0000 mg | CHEWABLE_TABLET | Freq: Once | ORAL | Status: AC
  Administered 2014-08-18: 324 mg via ORAL
  Filled 2014-08-18: qty 4

## 2014-08-18 MED ORDER — ACETAMINOPHEN 325 MG PO TABS: 650.0000 mg | ORAL_TABLET | ORAL | Status: DC | PRN

## 2014-08-18 MED ORDER — MORPHINE SULFATE 2 MG/ML IJ SOLN: 2.0000 mg | INTRAMUSCULAR | Status: DC | PRN

## 2014-08-18 MED ORDER — ASPIRIN EC 81 MG PO TBEC
81.0000 mg | DELAYED_RELEASE_TABLET | Freq: Every day | ORAL | Status: DC
  Administered 2014-08-18: 81 mg via ORAL
  Filled 2014-08-18: qty 1

## 2014-08-18 MED ORDER — GI COCKTAIL ~~LOC~~
30.0000 mL | Freq: Four times a day (QID) | ORAL | Status: DC | PRN
  Filled 2014-08-18: qty 30

## 2014-08-18 NOTE — Consult Note (Signed)
CARDIOLOGY CONSULT NOTE  Patient ID: Terri Bailey, MRN: [id number], DOB/AGE: 1971/09/04 43 y.o. Admit date: 08/17/2014 Date of Consult: 08/18/2014  Primary Physician: Kelton Pillar, MD Primary Cardiologist: Dr Acie Fredrickson Referring Physician: Dr Posey Pronto  Chief Complaint: Chest pain Reason for Consultation: Chest pain  HPI: 43 year old woman with history of paroxysmal supraventricular tachycardia presenting for evaluation of chest pain. She has had burning left neck and left chest pain with physical exertion and at rest over the past 3-4 days. Exertional symptoms have resolved with rest. Her last episode of pain was this morning and it was self-limited. She's had no lightheadedness or presyncope. Denies diaphoresis, nausea, or vomiting. She did have a stress echocardiogram last year and this demonstrated no significant abnormalities.  The patient was previously maintained on a beta blocker for many years. She stopped this about 2 years ago. This was used as treatment for paroxysmal SVT. She's had about 3 or 4 episodes per year since stopping the beta blocker. They last no more than 30-40 minutes. She otherwise has been in Health. She exercises 7 days per week. She eats a healthy diet, is a lifelong nonsmoker. She does have a family history of coronary artery disease. Her father died of a myocardial infarction in his 83s. Her mother had heart disease in her 77s. She has a sister who is healthy and 2 years younger than her.  Medical History:  Past Medical History  Diagnosis Date  . IBS (irritable bowel syndrome)   . Sinus tachycardia   . Fibroid   . Constipation, chronic   . Ovarian cyst       Surgical History:  Past Surgical History  Procedure Laterality Date  . Ovarian cyst surgery    . Uterine fibroid surgery       Home Meds: Prior to Admission medications   Medication Sig Start Date End Date Taking? Authorizing Provider  ALPRAZolam Duanne Moron) 0.5 MG tablet Take 0.5 mg by mouth 2  (two) times daily as needed for anxiety.   Yes Historical Provider, MD  BIOTIN PO Take 1 tablet by mouth daily.   Yes Historical Provider, MD  CALCIUM PO Take 1 tablet by mouth daily.   Yes Historical Provider, MD  clobetasol (OLUX) 0.05 % topical foam Apply 1 application topically daily as needed (rash).    Yes Historical Provider, MD  desonide (DESOWEN) 0.05 % cream Apply 1 application topically daily as needed (for rash).    Yes Historical Provider, MD  Flaxseed, Linseed, (FLAX SEED OIL PO) Take 1 tablet by mouth daily.   Yes Historical Provider, MD  glucosamine-chondroitin 500-400 MG tablet Take 1 tablet by mouth 2 (two) times daily.   Yes Historical Provider, MD  ketorolac (TORADOL) 10 MG tablet Take 1 tablet (10 mg total) by mouth every 6 (six) hours as needed for pain. 01/29/13  Yes Alwyn Pea, MD  Magnesium 250 MG TABS Take 250 mg by mouth daily.   Yes Historical Provider, MD  Multiple Vitamin (MULTIVITAMIN WITH MINERALS) TABS Take 1 tablet by mouth daily.    Yes Historical Provider, MD  Omega-3 Fatty Acids (FISH OIL PO) Take 2 capsules by mouth daily.   Yes Historical Provider, MD  OVER THE COUNTER MEDICATION Place 1 drop into both eyes 2 (two) times daily as needed. Allergy eye drops   Yes Historical Provider, MD  polyethylene glycol (MIRALAX / GLYCOLAX) packet Take 17 g by mouth 2 (two) times daily.   Yes Historical Provider, MD  PRESCRIPTION MEDICATION Take 1  tablet by mouth 2 (two) times daily. adrenal response supplement   Yes Historical Provider, MD  Probiotic Product (PROBIOTIC DAILY) CAPS Take 1 tablet by mouth daily.   Yes Historical Provider, MD    Inpatient Medications:  . aspirin EC  81 mg Oral Daily  . enoxaparin (LOVENOX) injection  40 mg Subcutaneous Q24H      Allergies: No Known Allergies  History   Social History  . Marital Status: Single    Spouse Name: N/A    Number of Children: N/A  . Years of Education: N/A   Occupational History  . Not on file.    Social History Main Topics  . Smoking status: Never Smoker   . Smokeless tobacco: Never Used  . Alcohol Use: 0.0 oz/week    0 Glasses of wine per week     Comment: socially  . Drug Use: No  . Sexual Activity: No   Other Topics Concern  . Not on file   Social History Narrative  . No narrative on file     Family History  Problem Relation Age of Onset  . Diabetes Mother   . Hypertension Mother   . Hyperlipidemia Mother   . Heart disease Father   . Heart attack Father   . Sudden death Father   . Cancer Maternal Grandmother   . Stroke Maternal Grandmother   . Hypertension Paternal Grandmother   . Hypertension Paternal Grandfather      Review of Systems: General: negative for chills, fever, night sweats or weight changes.  ENT: negative for rhinorrhea or epistaxis Cardiovascular: See history of present illness Dermatological: negative for rash Respiratory: negative for cough or wheezing GI: negative for nausea, vomiting, diarrhea, bright red blood per rectum, melena, or hematemesis GU: no hematuria, urgency, or frequency Neurologic: negative for visual changes, syncope, headache, or dizziness Heme: no easy bruising or bleeding Endo: negative for excessive thirst, thyroid disorder, or flushing Musculoskeletal: negative for joint pain or swelling, negative for myalgias  All other systems reviewed and are otherwise negative except as noted above.  Physical Exam: Blood pressure 85/48, pulse 73, temperature 97.9 F (36.6 C), temperature source Oral, resp. rate 16, height 5\' 4"  (1.626 m), weight 121 lb 14.6 oz (55.3 kg), last menstrual period 07/26/2014, SpO2 100.00%. Pt is alert and oriented, WD, thin healthy-appearing woman in no distress. HEENT: normal Neck: JVP normal. Carotid upstrokes normal without bruits. No thyromegaly. Lungs: equal expansion, clear bilaterally CV: Apex is discrete and nondisplaced, RRR without murmur or gallop Abd: soft, NT, +BS, no bruit, no  hepatosplenomegaly Back: no CVA tenderness Ext: no C/C/E        DP/PT pulses intact and = Skin: warm and dry without rash Neuro: CNII-XII intact             Strength intact = bilaterally    Labs:  Recent Labs  08/18/14 0042 08/18/14 0625  TROPONINI <0.30 <0.30   Lab Results  Component Value Date   WBC 5.0 08/18/2014   HGB 11.6* 08/18/2014   HCT 35.4* 08/18/2014   MCV 93.7 08/18/2014   PLT 181 08/18/2014    Recent Labs Lab 08/18/14 0625  NA 139  K 4.1  CL 105  CO2 23  BUN 24*  CREATININE 1.18*  CALCIUM 8.9  PROT 6.4  BILITOT 0.3  ALKPHOS 53  ALT 30  AST 30  GLUCOSE 103*   Lab Results  Component Value Date   CHOL 205* 01/07/2013   HDL 56 01/07/2013  [place] 134* 01/07/2013   TRIG 74 01/07/2013   Lab Results  Component Value Date   DDIMER 0.64* 08/18/2014    Radiology/Studies:  Dg Chest 2 View  08/18/2014   CLINICAL DATA:  Left-sided neck her stent radiates down left-sided chest on off for 4 days  EXAM: CHEST  2 VIEW  COMPARISON:  None.  FINDINGS: The heart size and mediastinal contours are within normal limits. Both lungs are clear. The visualized skeletal structures are unremarkable.  IMPRESSION: No active cardiopulmonary disease.   Electronically Signed   By: Skipper Cliche M.D.   On: 08/18/2014 01:52    EKG: NSR, within normal limits  Cardiac Studies: CXR: FINDINGS:  The heart size and mediastinal contours are within normal limits.  Both lungs are clear. The visualized skeletal structures are  unremarkable.  IMPRESSION:  No active cardiopulmonary disease  Echo: Study Conclusions  - Left ventricle: The cavity size was normal. Wall thickness was normal. Systolic function was normal. The estimated ejection fraction was in the range of 60% to 65%. Wall motion was normal; there were no regional wall motion abnormalities. Left ventricular diastolic function parameters were normal. - Aortic valve: There was trivial regurgitation.  ASSESSMENT AND PLAN:   43 year old woman with chest pain, typical and atypical features. Her overall risk is low and that she is a physically fit premenopausal woman. Cardiovascular risk factors include mildly elevated with an LDL of 134 and family history of premature CAD. Her EKG and cardiac enzymes are within normal limits. Echocardiogram is also within normal limits. Will arrange a functional study, likely exercise echocardiogram. If this cannot be done today, I would be inclined to let her go home and will arrange to be done as an outpatient in the next few days.  Signed, Sherren Mocha MD, [facility] 08/18/2014, 12:17 PM

## 2014-08-18 NOTE — Progress Notes (Signed)
  Echocardiogram 2D Echocardiogram has been performed.  Lam Bjorklund 08/18/2014, 9:57 AM

## 2014-08-18 NOTE — H&P (Signed)
[facility] History and Physical  Patient: Terri Bailey  ATF:[id number]  DOB: February 02, 1971  DOS: the patient was seen and examined on 08/18/2014 PCP: Kelton Pillar, MD  Chief Complaint: Chest pain  HPI: Terri Bailey is a 43 y.o. female with Past medical history of sinus tachycardia, dyslipidemia. The patient presented with complaints of chest pain. She mentions that the pain is located on the left side of the chest starting from her neck radiating to the lower ribs. She described the pain as a burning sensation without any statins injection. She denies any cough. She denies any fever or chills. She denies any nausea or vomiting denies any acid reflux denies any shortness of breath. She mentions her pain occurs primarily at exertion today but she also had on him out reoccurrence of the pain even at rest lasting for few minutes and resolving on its own.  she denies any leg swellings or leg tenderness. Although she mentions on Friday she had a drive oh 2-7/0 hours to and from. She denies using any herbal supplements takes multiple vitamins. She denies any active smoking denies any prior history of coronary artery disease. She had an echocardiogram as well as stress test wasn't 14 which was negative. She had a strong family history of coronary artery disease in her father as well as paternal grandmother in their 78s and 49s.  The patient is coming from home. And at her baseline independent for most of her ADL.  Review of Systems: as mentioned in the history of present illness.  A Comprehensive review of the other systems is negative.  Past Medical History  Diagnosis Date  . IBS (irritable bowel syndrome)   . Sinus tachycardia   . Fibroid   . Constipation, chronic   . Ovarian cyst    Past Surgical History  Procedure Laterality Date  . Ovarian cyst surgery    . Uterine fibroid surgery     Social History:  reports that she has never smoked. She has never used smokeless  tobacco. She reports that she drinks alcohol. She reports that she does not use illicit drugs.  No Known Allergies  Family History  Problem Relation Age of Onset  . Diabetes Mother   . Hypertension Mother   . Hyperlipidemia Mother   . Heart disease Father   . Heart attack Father   . Sudden death Father   . Cancer Maternal Grandmother   . Stroke Maternal Grandmother   . Hypertension Paternal Grandmother   . Hypertension Paternal Grandfather     Prior to Admission medications   Medication Sig Start Date End Date Taking? Authorizing Provider  ALPRAZolam Duanne Moron) 0.5 MG tablet Take 0.5 mg by mouth 2 (two) times daily as needed for anxiety.   Yes Historical Provider, MD  BIOTIN PO Take 1 tablet by mouth daily.   Yes Historical Provider, MD  CALCIUM PO Take 1 tablet by mouth daily.   Yes Historical Provider, MD  clobetasol (OLUX) 0.05 % topical foam Apply 1 application topically daily as needed (rash).    Yes Historical Provider, MD  desonide (DESOWEN) 0.05 % cream Apply 1 application topically daily as needed (for rash).    Yes Historical Provider, MD  Flaxseed, Linseed, (FLAX SEED OIL PO) Take 1 tablet by mouth daily.   Yes Historical Provider, MD  glucosamine-chondroitin 500-400 MG tablet Take 1 tablet by mouth 2 (two) times daily.   Yes Historical Provider, MD  ketorolac (TORADOL) 10 MG tablet Take 1 tablet (10 mg total) by mouth  every 6 (six) hours as needed for pain. 01/29/13  Yes Alwyn Pea, MD  Magnesium 250 MG TABS Take 250 mg by mouth daily.   Yes Historical Provider, MD  Multiple Vitamin (MULTIVITAMIN WITH MINERALS) TABS Take 1 tablet by mouth daily.    Yes Historical Provider, MD  Omega-3 Fatty Acids (FISH OIL PO) Take 2 capsules by mouth daily.   Yes Historical Provider, MD  OVER THE COUNTER MEDICATION Place 1 drop into both eyes 2 (two) times daily as needed. Allergy eye drops   Yes Historical Provider, MD  polyethylene glycol (MIRALAX / GLYCOLAX) packet Take 17 g by mouth 2  (two) times daily.   Yes Historical Provider, MD  PRESCRIPTION MEDICATION Take 1 tablet by mouth 2 (two) times daily. adrenal response supplement   Yes Historical Provider, MD  Probiotic Product (PROBIOTIC DAILY) CAPS Take 1 tablet by mouth daily.   Yes Historical Provider, MD    Physical Exam: Filed Vitals:   08/17/14 2344 08/18/14 0143 08/18/14 0158 08/18/14 0208  BP: 98/59 96/64 109/71   Pulse: 94 74 77   Temp: 98.4 F (36.9 C) 98.4 F (36.9 C) 98.4 F (36.9 C)   TempSrc: Oral Oral Oral   Resp: 18 16 16    Height:    5\' 4"  (1.626 m)  Weight:    55.3 kg (121 lb 14.6 oz)  SpO2: 99% 100% 100%     General: Alert, Awake and Oriented to Time, Place and Person. Appear in mild distress Eyes: PERRL ENT: Oral Mucosa clear moist. Neck: no JVD Cardiovascular: S1 and S2 Present, no Murmur, Peripheral Pulses Present Respiratory: Bilateral Air entry equal and Decreased, Clear to Auscultation, noCrackles, no wheezes Abdomen: Bowel Sound Present, Soft and Non tender Skin: no Rash Extremities: no Pedal edema, no calf tenderness Neurologic: Grossly no focal neuro deficit.  Labs on Admission:  CBC:  Recent Labs Lab 08/17/14 2033  WBC 6.8  HGB 12.9  HCT 38.7  MCV 93.7  PLT 199    CMP     Component Value Date/Time   NA 134* 08/17/2014 2033   K 4.5 08/17/2014 2033   CL 96 08/17/2014 2033   CO2 27 08/17/2014 2033   GLUCOSE 101* 08/17/2014 2033   BUN 30* 08/17/2014 2033   CREATININE 1.02 08/17/2014 2033   CREATININE 1.04 01/07/2013 1006   CALCIUM 9.7 08/17/2014 2033   PROT 7.4 01/20/2013 2140   ALBUMIN 4.0 01/20/2013 2140   AST 24 01/20/2013 2140   ALT 16 01/20/2013 2140   ALKPHOS 81 01/20/2013 2140   BILITOT 0.2* 01/20/2013 2140   GFRNONAA 66* 08/17/2014 2033   GFRAA 76* 08/17/2014 2033    No results found for this basename: LIPASE, AMYLASE,  in the last 168 hours No results found for this basename: AMMONIA,  in the last 168 hours   Recent Labs Lab 08/18/14 0042  TROPONINI <0.30    BNP (last 3 results) No results found for this basename: PROBNP,  in the last 8760 hours  Radiological Exams on Admission: Dg Chest 2 View  08/18/2014   CLINICAL DATA:  Left-sided neck her stent radiates down left-sided chest on off for 4 days  EXAM: CHEST  2 VIEW  COMPARISON:  None.  FINDINGS: The heart size and mediastinal contours are within normal limits. Both lungs are clear. The visualized skeletal structures are unremarkable.  IMPRESSION: No active cardiopulmonary disease.   Electronically Signed   By: Skipper Cliche M.D.   On: 08/18/2014 01:52  EKG: Independently reviewed. normal sinus rhythm, nonspecific ST and T waves changes. Assessment/Plan Principal Problem:   Chest pain at rest Active Problems:   SVT (supraventricular tachycardia)   Hyperlipidemia   1. Chest pain at rest The patient is presenting with complaints of chest pain. She is currently chest pain-free. At present she'll be admitted in the hospital March we will continue her on aspirin 81 mg under Cardiology has initially been consulted and recommended the patient to be observed in the hospital procedure troponins and they may recommend a stress test in the morning. With this the patient will be kept n.p.o. except medication. I will use when necessary morphine as well as when necessary Mylanta for pain. Echocardiogram in the morning. Check d-dimer in the morning for Chest x-ray clear.  2. Dyslipidemia. Patient has history of dyslipidemia not on any statins at present. Lipid profile in the morning tomorrow.  Consults: Cardiology  DVT Prophylaxis: subcutaneous Heparin Nutrition: N.p.o. except medication  Code Status: Full  Disposition: Admitted to observation in telemetry unit.  Author: Berle Mull, MD [facility] Pager: [phone] 08/18/2014, 4:31 AM    If 7PM-7AM, please contact night-coverage [email or web address] Password [email or web address]

## 2014-08-18 NOTE — ED Provider Notes (Signed)
CSN: [id number]     Arrival date & time 08/17/14  2003 History   First MD Initiated Contact with Patient 08/18/14 0003     Chief Complaint  Patient presents with  . Chest Pain     (Consider location/radiation/quality/duration/timing/severity/associated sxs/prior Treatment) HPI Patient presents with left-sided chest pain that radiates to her left neck. This been episodic in the past 4 days. Is associated with exertion and improves with rest. She states this evening she was working out and had similar symptoms. The chest pain is currently resolved. Is not associated with shortness of breath. Patient denies any lower extremity swelling or pain. Patient does not smoke. No history of hypertension or hyperlipidemia. Patient states her father had a MI at age 39. No previous coronary artery workup. Past Medical History  Diagnosis Date  . IBS (irritable bowel syndrome)   . Sinus tachycardia   . Fibroid   . Constipation, chronic   . Ovarian cyst    Past Surgical History  Procedure Laterality Date  . Ovarian cyst surgery    . Uterine fibroid surgery     Family History  Problem Relation Age of Onset  . Diabetes Mother   . Hypertension Mother   . Hyperlipidemia Mother   . Heart disease Father   . Heart attack Father   . Sudden death Father   . Cancer Maternal Grandmother   . Stroke Maternal Grandmother   . Hypertension Paternal Grandmother   . Hypertension Paternal Grandfather    History  Substance Use Topics  . Smoking status: Never Smoker   . Smokeless tobacco: Never Used  . Alcohol Use: 0.0 oz/week    0 Glasses of wine per week     Comment: socially   OB History   Grav Para Term Preterm Abortions TAB SAB Ect Mult Living   0 0 0 0 0 0 0 0 0 0      Review of Systems  Constitutional: Negative for fever and chills.  Respiratory: Negative for cough and shortness of breath.   Cardiovascular: Positive for chest pain. Negative for palpitations and leg swelling.  Gastrointestinal:  Negative for nausea, vomiting and abdominal pain.  Musculoskeletal: Negative for back pain, neck pain and neck stiffness.  Skin: Negative for rash and wound.  Neurological: Negative for dizziness, weakness, light-headedness, numbness and headaches.      Allergies  Review of patient's allergies indicates no known allergies.  Home Medications   Prior to Admission medications   Medication Sig Start Date End Date Taking? Authorizing Provider  ALPRAZolam Duanne Moron) 0.5 MG tablet Take 0.5 mg by mouth 2 (two) times daily as needed for anxiety.   Yes Historical Provider, MD  BIOTIN PO Take 1 tablet by mouth daily.   Yes Historical Provider, MD  CALCIUM PO Take 1 tablet by mouth daily.   Yes Historical Provider, MD  clobetasol (OLUX) 0.05 % topical foam Apply 1 application topically daily as needed (rash).    Yes Historical Provider, MD  desonide (DESOWEN) 0.05 % cream Apply 1 application topically daily as needed (for rash).    Yes Historical Provider, MD  Flaxseed, Linseed, (FLAX SEED OIL PO) Take 1 tablet by mouth daily.   Yes Historical Provider, MD  glucosamine-chondroitin 500-400 MG tablet Take 1 tablet by mouth 2 (two) times daily.   Yes Historical Provider, MD  ketorolac (TORADOL) 10 MG tablet Take 1 tablet (10 mg total) by mouth every 6 (six) hours as needed for pain. 01/29/13  Yes Alwyn Pea, MD  Magnesium 250 MG TABS Take 250 mg by mouth daily.   Yes Historical Provider, MD  Multiple Vitamin (MULTIVITAMIN WITH MINERALS) TABS Take 1 tablet by mouth daily.    Yes Historical Provider, MD  Omega-3 Fatty Acids (FISH OIL PO) Take 2 capsules by mouth daily.   Yes Historical Provider, MD  OVER THE COUNTER MEDICATION Place 1 drop into both eyes 2 (two) times daily as needed. Allergy eye drops   Yes Historical Provider, MD  polyethylene glycol (MIRALAX / GLYCOLAX) packet Take 17 g by mouth 2 (two) times daily.   Yes Historical Provider, MD  PRESCRIPTION MEDICATION Take 1 tablet by mouth 2 (two)  times daily. adrenal response supplement   Yes Historical Provider, MD  Probiotic Product (PROBIOTIC DAILY) CAPS Take 1 tablet by mouth daily.   Yes Historical Provider, MD   BP 98/59  Pulse 94  Temp(Src) 98.4 F (36.9 C) (Oral)  Resp 18  SpO2 99%  LMP 07/26/2014 Physical Exam  ED Course  Procedures (including critical care time) Labs Review Labs Reviewed  BASIC METABOLIC PANEL - Abnormal; Notable for the following:    Sodium 134 (*)    Glucose, Bld 101 (*)    BUN 30 (*)    GFR calc non Af Amer 66 (*)    GFR calc Af Amer 76 (*)    All other components within normal limits  CBC  TROPONIN I  HCG, SERUM, QUALITATIVE  I-STAT TROPOININ, ED    Imaging Review No results found.   EKG Interpretation None      Date: 08/18/2014  Rate: 76  Rhythm: normal sinus rhythm  QRS Axis: normal  Intervals: normal  ST/T Wave abnormalities: normal  Conduction Disutrbances:none  Narrative Interpretation:   Old EKG Reviewed: none available   MDM   Final diagnoses:  Chest pain, unspecified chest pain type    Patient is currently chest pain-free. Discussed with Dr. Aundra Dubin. Agrees with admission to hospitalist and inpatient stress testing. Dr. Roel Cluck will see and admit patient. Aspirin given in the emergency department.    Julianne Rice, MD 08/18/14 [phone]

## 2014-08-18 NOTE — Progress Notes (Signed)
Echocardiogram Echocardiogram Stress Test has been performed.  Joelene Millin 08/18/2014, 2:32 PM

## 2014-08-18 NOTE — Progress Notes (Signed)
UR completed 

## 2014-08-18 NOTE — Discharge Summary (Signed)
Physician Discharge Summary  Terri Bailey QVZ:[id number] DOB: 1971-09-02 DOA: 08/17/2014  PCP: Kelton Pillar, MD  Admit date: 08/17/2014 Discharge date: 08/18/2014  Time spent: 40 minutes  Recommendations for Outpatient Follow-up:  1. Followup with Dr. Suzi Roots within one week. 2. Follow on BMP, specifically on renal function.  Discharge Diagnoses:  Principal Problem:   Chest pain at rest Active Problems:   SVT (supraventricular tachycardia)   Hyperlipidemia   Discharge Condition:  stable   Diet recommendation: Regular diet  Filed Weights   08/18/14 0208  Weight: 55.3 kg (121 lb 14.6 oz)    History of present illness:  Terri Bailey is a 43 y.o. female with Past medical history of sinus tachycardia, dyslipidemia.  The patient presented with complaints of chest pain. She mentions that the pain is located on the left side of the chest starting from her neck radiating to the lower ribs. She described the pain as a burning sensation. She denies any cough. She denies any fever or chills. She denies any nausea or vomiting denies any acid reflux denies any shortness of breath.  She mentions her pain occurs primarily at exertion today but she also had on him out reoccurrence of the pain even at rest lasting for few minutes and resolving on its own.  she denies any leg swellings or leg tenderness. Although she mentions on Friday she had a drive oh 3-2/9 hours to and from.  She denies using any herbal supplements takes multiple vitamins.  She denies any active smoking denies any prior history of coronary artery disease. She had an echocardiogram as well as stress test wasn't 14 which was negative.  She had a strong family history of coronary artery disease in her father as well as paternal grandmother in their 13s and 58s.  The patient is coming from home. And at her baseline independent for most of her ADL.  Hospital Course:   Chest pain -Patient presents to the hospital with  atypical chest pain comes and goes for the past several days. -Pain comes when she goes to the gym, she thought this is exertional chest pain. -Patient works out every day if not twice a day. -Seen by cardiology an echocardiogram stress test was done, after ACLS was ruled out by 3 negative troponin.  -Chest pain is likely musculoskeletal pain, patient is following with PT as outpatient for trapezius strain and levator scapulae syndrome.  Acute kidney injury -Mild increase in creatinine, for her weight. -Creatinine is 1.18, her normal supposed to be <1.10. -Have asked her not to take Toradol or any other NSAIDs, keep herself hydrated especially when she works out. -Patient needs to followup with her PCP for her renal function.  Dyslipidemia -Continue omega-3 fatty acids.  Chronic constipation -Continue MiraLax, probiotics and flaxseeds  Procedures:  Echocardiogram stress test without contrast done on 08/18/14, read by Dr. Andrena Mews Conclusions: - Stress ECG conclusions: There were no stress arrhythmias or conduction abnormalities. The stress ECG was negative for ischemia. - Staged echo: There was no echocardiographic evidence for stress-induced ischemia.   Consultations:  Cardiology, Dr. Gerrit Halls  Discharge Exam: Filed Vitals:   08/18/14 0457  BP: 85/48  Pulse: 73  Temp: 97.9 F (36.6 C)  Resp: 16   General: Alert and awake, oriented x3, not in any acute distress. HEENT: anicteric sclera, pupils reactive to light and accommodation, EOMI CVS: S1-S2 clear, no murmur rubs or gallops Chest: clear to auscultation bilaterally, no wheezing, rales or rhonchi Abdomen: soft nontender, nondistended, normal  bowel sounds, no organomegaly Extremities: no cyanosis, clubbing or edema noted bilaterally Neuro: Cranial nerves II-XII intact, no focal neurological deficits  Discharge Instructions You were cared for by a hospitalist during your hospital stay. If you have any questions  about your discharge medications or the care you received while you were in the hospital after you are discharged, you can call the unit and asked to speak with the hospitalist on call if the hospitalist that took care of you is not available. Once you are discharged, your primary care physician will handle any further medical issues. Please note that NO REFILLS for any discharge medications will be authorized once you are discharged, as it is imperative that you return to your primary care physician (or establish a relationship with a primary care physician if you do not have one) for your aftercare needs so that they can reassess your need for medications and monitor your lab values.  Discharge Instructions   Increase activity slowly    Complete by:  As directed      Other Restrictions    Complete by:  As directed   Avoid NSAIDs          Current Discharge Medication List    CONTINUE these medications which have NOT CHANGED   Details  ALPRAZolam (XANAX) 0.5 MG tablet Take 0.5 mg by mouth 2 (two) times daily as needed for anxiety.    BIOTIN PO Take 1 tablet by mouth daily.    CALCIUM PO Take 1 tablet by mouth daily.    clobetasol (OLUX) 0.05 % topical foam Apply 1 application topically daily as needed (rash).     desonide (DESOWEN) 0.05 % cream Apply 1 application topically daily as needed (for rash).     Flaxseed, Linseed, (FLAX SEED OIL PO) Take 1 tablet by mouth daily.    glucosamine-chondroitin 500-400 MG tablet Take 1 tablet by mouth 2 (two) times daily.    Magnesium 250 MG TABS Take 250 mg by mouth daily.    Multiple Vitamin (MULTIVITAMIN WITH MINERALS) TABS Take 1 tablet by mouth daily.     Omega-3 Fatty Acids (FISH OIL PO) Take 2 capsules by mouth daily.    OVER THE COUNTER MEDICATION Place 1 drop into both eyes 2 (two) times daily as needed. Allergy eye drops    polyethylene glycol (MIRALAX / GLYCOLAX) packet Take 17 g by mouth 2 (two) times daily.    PRESCRIPTION  MEDICATION Take 1 tablet by mouth 2 (two) times daily. adrenal response supplement    Probiotic Product (PROBIOTIC DAILY) CAPS Take 1 tablet by mouth daily.      STOP taking these medications     ketorolac (TORADOL) 10 MG tablet        No Known Allergies Follow-up Information   Follow up with SCHOENHOFF,DEBBIE, MD. Schedule an appointment as soon as possible for a visit in 1 week.   Specialty:  Internal Medicine   Contact information:   [address] [place] [address] [phone]        The results of significant diagnostics from this hospitalization (including imaging, microbiology, ancillary and laboratory) are listed below for reference.    Significant Diagnostic Studies: Dg Chest 2 View  08/18/2014   CLINICAL DATA:  Left-sided neck her stent radiates down left-sided chest on off for 4 days  EXAM: CHEST  2 VIEW  COMPARISON:  None.  FINDINGS: The heart size and mediastinal contours are within normal limits. Both lungs are clear.  The visualized skeletal structures are unremarkable.  IMPRESSION: No active cardiopulmonary disease.   Electronically Signed   By: Skipper Cliche M.D.   On: 08/18/2014 01:52    Microbiology: No results found for this or any previous visit (from the past 240 hour(s)).   Labs: Basic Metabolic Panel:  Recent Labs Lab 08/17/14 2033 08/18/14 0625  NA 134* 139  K 4.5 4.1  CL 96 105  CO2 27 23  GLUCOSE 101* 103*  BUN 30* 24*  CREATININE 1.02 1.18*  CALCIUM 9.7 8.9   Liver Function Tests:  Recent Labs Lab 08/18/14 0625  AST 30  ALT 30  ALKPHOS 53  BILITOT 0.3  PROT 6.4  ALBUMIN 3.3*   No results found for this basename: LIPASE, AMYLASE,  in the last 168 hours No results found for this basename: AMMONIA,  in the last 168 hours CBC:  Recent Labs Lab 08/17/14 2033 08/18/14 0625  WBC 6.8 5.0  NEUTROABS  --  2.6  HGB 12.9 11.6*  HCT 38.7 35.4*  MCV 93.7 93.7  PLT 199 181   Cardiac Enzymes:  Recent  Labs Lab 08/18/14 0042 08/18/14 0625 08/18/14 1227  TROPONINI <0.30 <0.30 <0.30   BNP: BNP (last 3 results) No results found for this basename: PROBNP,  in the last 8760 hours CBG: No results found for this basename: GLUCAP,  in the last 168 hours     Signed:  Amin Fornwalt A  [facility] 08/18/2014, 4:21 PM

## 2014-08-18 NOTE — Care Management Note (Signed)
    Page 1 of 1   08/18/2014     12:43:35 PM CARE MANAGEMENT NOTE 08/18/2014  Patient:  Terri Bailey, Terri Bailey   Account Number:  [id number]  Date Initiated:  08/18/2014  Documentation initiated by:  Dessa Phi  Subjective/Objective Assessment:   43 Y/O F ADMITTED W/CHEST PAIN.     Action/Plan:   FROM HOME.   Anticipated DC Date:  08/19/2014   Anticipated DC Plan:  [place]  CM consult      Choice offered to / List presented to:             Status of service:  In process, will continue to follow Medicare Important Message given?   (If response is "NO", the following Medicare IM given date fields will be blank) Date Medicare IM given:   Medicare IM given by:   Date Additional Medicare IM given:   Additional Medicare IM given by:    Discharge Disposition:    Per UR Regulation:  Reviewed for med. necessity/level of care/duration of stay  If discussed at [place] of Stay Meetings, dates discussed:    Comments:  08/18/14 Kristain Filo RN,BSN NCM [phone] NO ANTICIPATED D/C NEEDS.

## 2014-08-20 ENCOUNTER — Ambulatory Visit: Payer: BC Managed Care – PPO | Admitting: Rehabilitation

## 2014-08-20 DIAGNOSIS — IMO0001 Reserved for inherently not codable concepts without codable children: Secondary | ICD-10-CM | POA: Diagnosis not present

## 2014-09-01 ENCOUNTER — Encounter: Payer: BC Managed Care – PPO | Admitting: Rehabilitation

## 2014-09-04 ENCOUNTER — Ambulatory Visit: Payer: BC Managed Care – PPO | Attending: Internal Medicine | Admitting: Physical Therapy

## 2014-09-04 DIAGNOSIS — M25561 Pain in right knee: Secondary | ICD-10-CM | POA: Diagnosis present

## 2014-09-04 DIAGNOSIS — M25571 Pain in right ankle and joints of right foot: Secondary | ICD-10-CM | POA: Diagnosis present

## 2014-09-04 DIAGNOSIS — R5381 Other malaise: Secondary | ICD-10-CM | POA: Diagnosis not present

## 2014-09-04 DIAGNOSIS — M2560 Stiffness of unspecified joint, not elsewhere classified: Secondary | ICD-10-CM | POA: Insufficient documentation

## 2014-09-08 ENCOUNTER — Encounter: Payer: BC Managed Care – PPO | Admitting: Rehabilitation

## 2014-09-11 ENCOUNTER — Ambulatory Visit (HOSPITAL_BASED_OUTPATIENT_CLINIC_OR_DEPARTMENT_OTHER): Payer: BC Managed Care – PPO

## 2014-09-21 ENCOUNTER — Telehealth: Payer: Self-pay

## 2014-09-21 ENCOUNTER — Other Ambulatory Visit: Payer: Self-pay | Admitting: *Deleted

## 2014-09-21 ENCOUNTER — Ambulatory Visit (HOSPITAL_BASED_OUTPATIENT_CLINIC_OR_DEPARTMENT_OTHER): Payer: BC Managed Care – PPO

## 2014-09-21 NOTE — Telephone Encounter (Signed)
Terri Bailey  I do not recall prescribing this for pt  Have pharmacy fax this to our office

## 2014-09-21 NOTE — Telephone Encounter (Signed)
Terri Bailey  [phone]  [facility] [address], [place] called to say she was going to be doing a lot of traveling in November and December and she needs some  ALPRAZolam (XANAX) 0.5 MG tablet to get her thru these months. Some of the trips will be long trips.

## 2014-09-21 NOTE — Telephone Encounter (Signed)
Refill request- Terri Bailey called to say she was going to be doing a lot of traveling in November and December and she needs some ALPRAZolam (XANAX) 0.5 MG tablet to get her thru these months. Some of the trips will be long trips

## 2014-09-21 NOTE — Telephone Encounter (Signed)
Terri Bailey [phone] [name] [name], [name] called to say she was going to be doing a lot of traveling in November and December and she needs some

## 2014-09-21 NOTE — Telephone Encounter (Signed)
RX forwarded to Dr. Coralyn Mark

## 2014-09-22 MED ORDER — ALPRAZOLAM 0.5 MG PO TABS: 0.5000 mg | ORAL_TABLET | Freq: Every day | ORAL | Status: DC | PRN

## 2014-09-22 NOTE — Telephone Encounter (Signed)
RX called into [facility]-eh

## 2014-09-29 ENCOUNTER — Ambulatory Visit: Payer: BC Managed Care – PPO | Admitting: Sports Medicine

## 2014-09-30 NOTE — ED Provider Notes (Signed)
PE 08/18/14  Physical Exam  Constitutional: She is oriented to person, place, and time. She appears well-developed and well-nourished. No distress.  HENT:  Head: Normocephalic and atraumatic.  Mouth/Throat: Oropharynx is clear and moist.  Eyes: EOM are normal. Pupils are equal, round, and reactive to light.  Neck: Normal range of motion. Neck supple.  Cardiovascular: Normal rate and regular rhythm.  Exam reveals no gallop and no friction rub.   No murmur heard. Pulmonary/Chest: Effort normal and breath sounds normal. No respiratory distress. She has no wheezes. She has no rales. She exhibits no tenderness.  Abdominal: Soft. Bowel sounds are normal. She exhibits no distension and no mass. There is no tenderness. There is no rebound and no guarding.  Musculoskeletal: Normal range of motion. She exhibits no edema or tenderness.  No calf swelling or tenderness.  Neurological: She is alert and oriented to person, place, and time.  Moves all extremities without deficit. Sensation is grossly intact.  Skin: Skin is warm and dry. No rash noted. No erythema.  Psychiatric: She has a normal mood and affect. Her behavior is normal.  Nursing note and vitals reviewed.   Julianne Rice, MD 09/30/14 [phone]

## 2014-10-07 ENCOUNTER — Ambulatory Visit: Payer: BC Managed Care – PPO | Admitting: Sports Medicine

## 2014-10-09 ENCOUNTER — Ambulatory Visit (HOSPITAL_BASED_OUTPATIENT_CLINIC_OR_DEPARTMENT_OTHER)
Admission: RE | Admit: 2014-10-09 | Discharge: 2014-10-09 | Disposition: A | Discharge status: Home / Self Care | Payer: BC Managed Care – PPO | Source: Ambulatory Visit | Attending: Internal Medicine | Admitting: Internal Medicine

## 2014-10-09 DIAGNOSIS — Z1231 Encounter for screening mammogram for malignant neoplasm of breast: Secondary | ICD-10-CM | POA: Diagnosis present

## 2014-10-09 DIAGNOSIS — R928 Other abnormal and inconclusive findings on diagnostic imaging of breast: Secondary | ICD-10-CM | POA: Diagnosis not present

## 2014-10-14 ENCOUNTER — Other Ambulatory Visit: Payer: Self-pay | Admitting: Internal Medicine

## 2014-10-14 DIAGNOSIS — R928 Other abnormal and inconclusive findings on diagnostic imaging of breast: Secondary | ICD-10-CM

## 2014-10-19 ENCOUNTER — Other Ambulatory Visit: Payer: Self-pay

## 2014-10-19 NOTE — Telephone Encounter (Signed)
Terri Bailey called asking for more Xanax. We just gave her some last month for traveling #6. Did you want to refill this again?

## 2014-10-19 NOTE — Telephone Encounter (Signed)
Terri Bailey [phone] [place]  Arnetha called to say she needs a refill on her ALPRAZolam Duanne Moron) 0.5 MG tablet

## 2014-10-20 MED ORDER — ALPRAZOLAM 0.5 MG PO TABS: ORAL_TABLET | ORAL | Status: DC

## 2014-10-20 NOTE — Telephone Encounter (Signed)
RX called into [facility]-eh

## 2014-11-23 ENCOUNTER — Ambulatory Visit
Admission: RE | Admit: 2014-11-23 | Discharge: 2014-11-23 | Disposition: A | Discharge status: Home / Self Care | Payer: BC Managed Care – PPO | Source: Ambulatory Visit | Attending: Internal Medicine | Admitting: Internal Medicine

## 2014-11-23 DIAGNOSIS — R928 Other abnormal and inconclusive findings on diagnostic imaging of breast: Secondary | ICD-10-CM

## 2014-11-29 ENCOUNTER — Encounter: Payer: Self-pay | Admitting: Internal Medicine

## 2014-11-30 ENCOUNTER — Telehealth: Payer: Self-pay | Admitting: *Deleted

## 2014-11-30 NOTE — Telephone Encounter (Signed)
-----   Message from Lanice Shirts, MD sent at 11/29/2014 10:30 AM EST ----- Call pt and let her know that her ultrasound of her right breast  Shows  No mass or any evidence of cancer.    She should have a screening mammogram once a year

## 2014-11-30 NOTE — Telephone Encounter (Signed)
I spoke with Anderson Malta and gave her the U/S results of her breast-eh

## 2014-12-03 ENCOUNTER — Ambulatory Visit: Payer: Self-pay | Admitting: Internal Medicine

## 2014-12-09 ENCOUNTER — Encounter: Payer: Self-pay | Admitting: Internal Medicine

## 2014-12-09 ENCOUNTER — Ambulatory Visit (INDEPENDENT_AMBULATORY_CARE_PROVIDER_SITE_OTHER): Payer: BC Managed Care – PPO | Admitting: Internal Medicine

## 2014-12-09 VITALS — BP 115/61 | HR 80 | Resp 16 | Ht 64.0 in | Wt 124.0 lb

## 2014-12-09 DIAGNOSIS — R748 Abnormal levels of other serum enzymes: Secondary | ICD-10-CM

## 2014-12-09 DIAGNOSIS — R7989 Other specified abnormal findings of blood chemistry: Secondary | ICD-10-CM

## 2014-12-09 DIAGNOSIS — F40243 Fear of flying: Secondary | ICD-10-CM | POA: Diagnosis not present

## 2014-12-09 LAB — BASIC METABOLIC PANEL
BUN: 23 mg/dL (ref 6–23)
CO2: 32 meq/L (ref 19–32)
Calcium: 9.5 mg/dL (ref 8.4–10.5)
Chloride: 100 mEq/L (ref 96–112)
Creat: 0.97 mg/dL (ref 0.50–1.10)
GLUCOSE: 77 mg/dL (ref 70–99)
POTASSIUM: 4.4 meq/L (ref 3.5–5.3)
SODIUM: 138 meq/L (ref 135–145)

## 2014-12-09 LAB — POCT URINALYSIS DIPSTICK
Bilirubin, UA: NEGATIVE
Glucose, UA: NEGATIVE
KETONES UA: NEGATIVE
Leukocytes, UA: NEGATIVE
Nitrite, UA: NEGATIVE
PH UA: 6.5
PROTEIN UA: NEGATIVE
RBC UA: NEGATIVE
Spec Grav, UA: 1.015
UROBILINOGEN UA: NEGATIVE

## 2014-12-09 MED ORDER — ALPRAZOLAM 0.5 MG PO TABS: ORAL_TABLET | ORAL | Status: DC

## 2014-12-09 NOTE — Patient Instructions (Signed)
To go to xray today   To lab today

## 2014-12-09 NOTE — Progress Notes (Signed)
Subjective:    Patient ID: Terri Bailey, female    DOB: February 25, 1971, 44 y.o.   MRN: [id number]  HPI  07/2014 hospital note Chest pain -Patient presents to the hospital with atypical chest pain comes and goes for the past several days. -Pain comes when she goes to the gym, she thought this is exertional chest pain. -Patient works out every day if not twice a day. -Seen by cardiology an echocardiogram stress test was done, after ACLS was ruled out by 3 negative troponin.  -Chest pain is likely musculoskeletal pain, patient is following with PT as outpatient for trapezius strain and levator scapulae syndrome.  Acute kidney injury -Mild increase in creatinine, for her weight. -Creatinine is 1.18, her normal supposed to be <1.10. -Have asked her not to take Toradol or any other NSAIDs, keep herself hydrated especially when she works out. -Patient needs to followup with her PCP for her renal function.  Dyslipidemia -Continue omega-3 fatty acids.  Chronic constipation -Continue MiraLax, probiotics and flaxseeds  Procedures:  Echocardiogram stress test without contrast done on 08/18/14, read by Dr. Andrena Mews Conclusions: - Stress ECG conclusions: There were no stress arrhythmias or conduction abnormalities. The stress ECG was negative for ischemia. - Staged echo: There was no echocardiographic evidence for stress-induced ischemia.   Consultations:  Cardiology, Dr. Gerrit Halls  TODAY: Terri Bailey is here with a concern  About slighlty elevated creatinine.  She has been to [facility] and she has been on progesterone creme    See labs  Creatinine has been in normal range and occasionally 1.18 or 1.2.  No FH of renal disease, stone  or renal malignancy .  She is asymptomatic  She denies dysuria, frequency, or urgency   Has not seen blood in urine  She has also been flying more with her job and uses Xanax for flying phobia  No Known Allergies Past Medical History    Diagnosis Date  . IBS (irritable bowel syndrome)   . Sinus tachycardia   . Fibroid   . Constipation, chronic   . Ovarian cyst    Past Surgical History  Procedure Laterality Date  . Ovarian cyst surgery    . Uterine fibroid surgery     History   Social History  . Marital Status: Single    Spouse Name: N/A    Number of Children: N/A  . Years of Education: N/A   Occupational History  . Not on file.   Social History Main Topics  . Smoking status: Never Smoker   . Smokeless tobacco: Never Used  . Alcohol Use: 0.0 oz/week    0 Glasses of wine per week     Comment: socially  . Drug Use: No  . Sexual Activity: No   Other Topics Concern  . Not on file   Social History Narrative  . No narrative on file   Family History  Problem Relation Age of Onset  . Diabetes Mother   . Hypertension Mother   . Hyperlipidemia Mother   . Heart disease Father   . Heart attack Father   . Sudden death Father   . Cancer Maternal Grandmother   . Stroke Maternal Grandmother   . Hypertension Paternal Grandmother   . Hypertension Paternal Grandfather    Patient Active Problem List   Diagnosis Date Noted  . Chest pain 08/18/2014  . Chest pain at rest 08/18/2014  . Right patellofemoral syndrome 06/17/2014  . Pain in lateral portion of right ankle 06/17/2014  .  Neck pain 12/29/2013  . Equinus deformity of foot, acquired 12/15/2013  . Plantar fasciitis of right foot 12/15/2013  . Metatarsal deformity 12/15/2013  . Trapezius muscle strain 12/03/2013  . Eczema 07/30/2013  . Hyperlipidemia 04/16/2013  . Knee pain, bilateral 01/28/2013  . Abnormality of gait 01/28/2013  .  Constipaiton by outlet obstruction  [facility] 01/07/2013  . Fibroid 01/07/2013  . SVT (supraventricular tachycardia) 01/07/2013  . Ovarian cyst 01/07/2013  . 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 ALPRAZolam (XANAX) 0.5 MG tablet Take one  tablet po prn anxiety 6 tablet 0  . BIOTIN PO Take 1 tablet by mouth daily.    Marland Kitchen CALCIUM PO Take 1 tablet by mouth daily.    . clobetasol (OLUX) 0.05 % topical foam Apply 1 application topically daily as needed (rash).     Marland Kitchen desonide (DESOWEN) 0.05 % cream Apply 1 application topically daily as needed (for rash).     . Flaxseed, Linseed, (FLAX SEED OIL PO) Take 1 tablet by mouth daily.    Marland Kitchen glucosamine-chondroitin 500-400 MG tablet Take 1 tablet by mouth 2 (two) times daily.    . Magnesium 250 MG TABS Take 250 mg by mouth daily.    . Multiple Vitamin (MULTIVITAMIN WITH MINERALS) TABS Take 1 tablet by mouth daily.     . Omega-3 Fatty Acids (FISH OIL PO) Take 2 capsules by mouth daily.    Marland Kitchen OVER THE COUNTER MEDICATION Place 1 drop into both eyes 2 (two) times daily as needed. Allergy eye drops    . polyethylene glycol (MIRALAX / GLYCOLAX) packet Take 17 g by mouth 2 (two) times daily.    Marland Kitchen PRESCRIPTION MEDICATION Take 1 tablet by mouth 2 (two) times daily. adrenal response supplement    . Probiotic Product (PROBIOTIC DAILY) CAPS Take 1 tablet by mouth daily.     No current facility-administered medications on file prior to visit.      Review of Systems See HPI    Objective:   Physical Exam Physical Exam  Nursing note and vitals reviewed.  Constitutional: She is oriented to person, place, and time. She appears well-developed and well-nourished.  HENT:  Head: Normocephalic and atraumatic.  Cardiovascular: Normal rate and regular rhythm. Exam reveals no gallop and no friction rub.  No murmur heard.  Pulmonary/Chest: Breath sounds normal. She has no wheezes. She has no rales.  Abd:  Soft NT/ND  BS pos no HSM  No CVA tenderness Neurological: She is alert and oriented to person, place, and time.  Skin: Skin is warm and dry.  Psychiatric: She has a normal mood and affect. Her behavior is normal.       Assessment & Plan:  Minimal elevated creatinine  U/A today normal  Will get renal  ultrasound   Flying phobia  Ok for Xanax #6 with one rf

## 2014-12-10 ENCOUNTER — Ambulatory Visit (HOSPITAL_BASED_OUTPATIENT_CLINIC_OR_DEPARTMENT_OTHER)
Admission: RE | Admit: 2014-12-10 | Discharge: 2014-12-10 | Disposition: A | Discharge status: Home / Self Care | Payer: BLUE CROSS/BLUE SHIELD | Source: Ambulatory Visit | Attending: Internal Medicine | Admitting: Internal Medicine

## 2014-12-10 DIAGNOSIS — N2889 Other specified disorders of kidney and ureter: Secondary | ICD-10-CM | POA: Diagnosis not present

## 2014-12-10 DIAGNOSIS — R7989 Other specified abnormal findings of blood chemistry: Secondary | ICD-10-CM

## 2014-12-10 DIAGNOSIS — R944 Abnormal results of kidney function studies: Secondary | ICD-10-CM | POA: Diagnosis present

## 2014-12-10 DIAGNOSIS — N281 Cyst of kidney, acquired: Secondary | ICD-10-CM | POA: Insufficient documentation

## 2014-12-13 ENCOUNTER — Encounter: Payer: Self-pay | Admitting: Internal Medicine

## 2014-12-14 ENCOUNTER — Telehealth: Payer: Self-pay | Admitting: Internal Medicine

## 2014-12-14 NOTE — Telephone Encounter (Signed)
Left message on mobile to call office to discuss kidney ultrasound results

## 2014-12-16 ENCOUNTER — Telehealth: Payer: Self-pay | Admitting: Internal Medicine

## 2014-12-16 DIAGNOSIS — R93429 Abnormal radiologic findings on diagnostic imaging of unspecified kidney: Secondary | ICD-10-CM

## 2014-12-16 NOTE — Telephone Encounter (Signed)
Second message left on pts personal cell phone to call office regarding kidney ultrasound report and lab tests

## 2014-12-16 NOTE — Telephone Encounter (Signed)
Left message on pt mobile to call office regarding renal ultrasound

## 2014-12-17 ENCOUNTER — Encounter: Payer: BC Managed Care – PPO | Admitting: Internal Medicine

## 2014-12-22 NOTE — Telephone Encounter (Signed)
Info sent to [facility]. They will contact patient with an appointment- eh

## 2015-01-19 ENCOUNTER — Other Ambulatory Visit: Payer: Self-pay | Admitting: *Deleted

## 2015-01-19 MED ORDER — VALACYCLOVIR HCL 500 MG PO TABS: ORAL_TABLET | ORAL | Status: AC

## 2015-01-19 MED ORDER — ALPRAZOLAM 0.5 MG PO TABS: ORAL_TABLET | ORAL | Status: AC

## 2015-01-19 NOTE — Telephone Encounter (Signed)
Refill request

## 2015-01-19 NOTE — Telephone Encounter (Signed)
Ok to call in Xanax  Did this pt get an appt with kidney group that we set up?  Route back to me about referral

## 2015-01-19 NOTE — Telephone Encounter (Signed)
She has been in contact with the Kidney specialist and because of her work schedule she is on their waiting list

## 2015-01-23 IMAGING — MG MM DIGITAL SCREENING BILAT W/ CAD
4 series · 4 of 4 positions shown · non-contrast
Comparison: Previous exam(s)

CLINICAL DATA: Screening.

EXAM:
DIGITAL SCREENING BILATERAL MAMMOGRAM WITH CAD

[R CC]
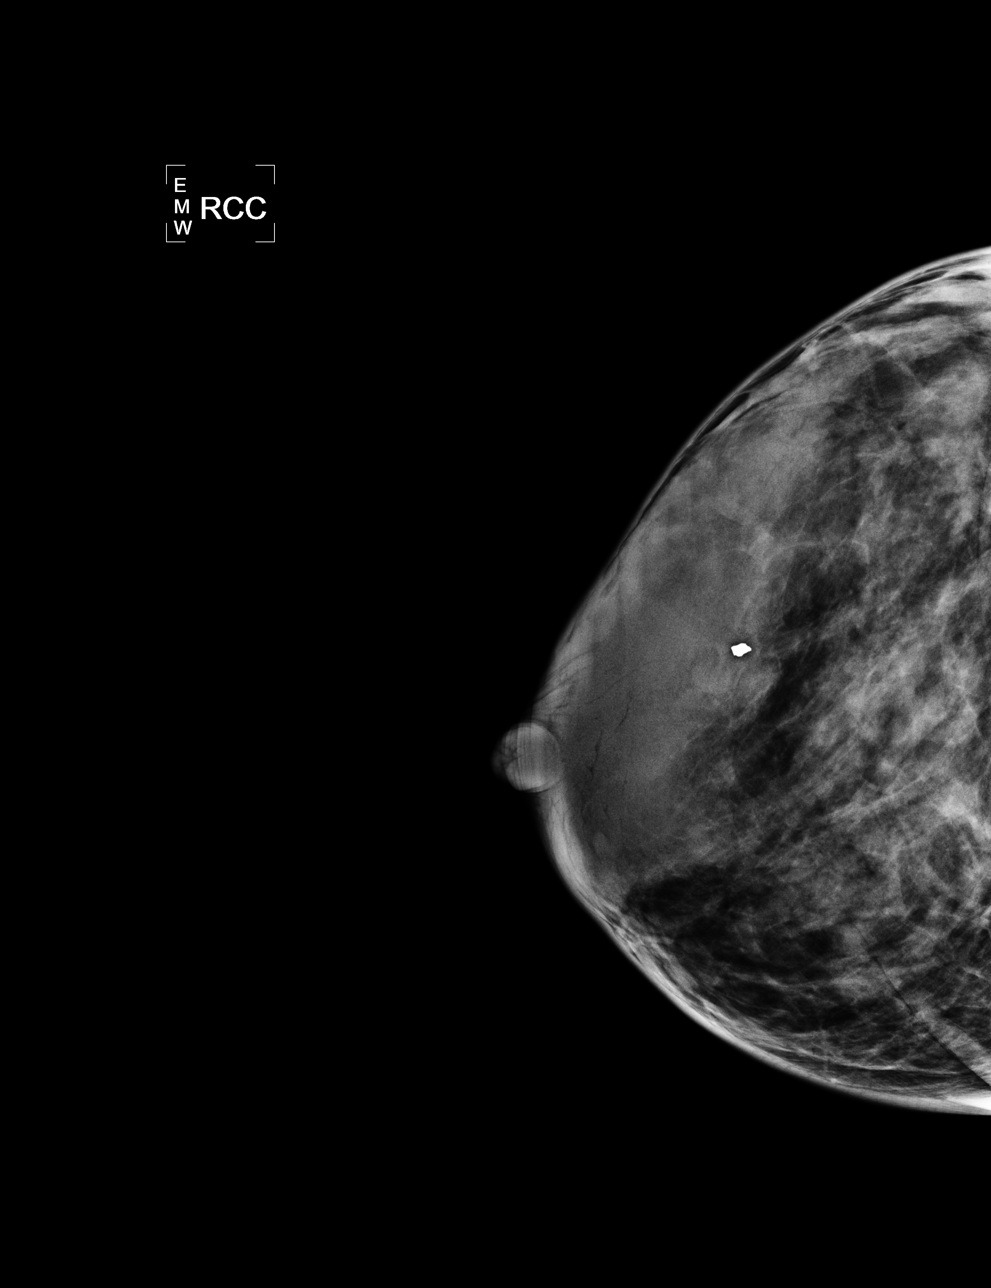

[L CC]
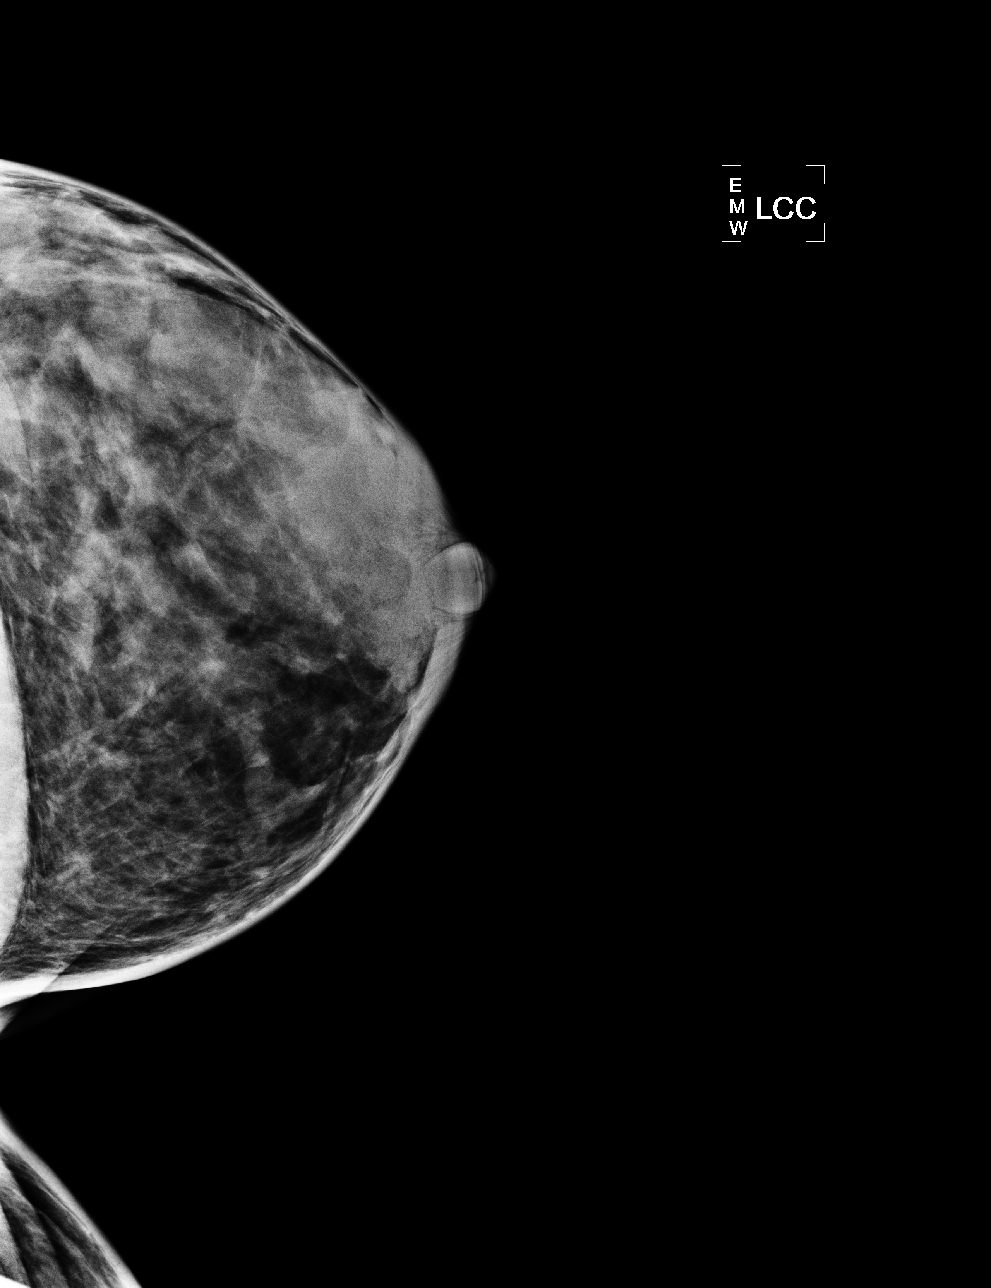

[L MLO]
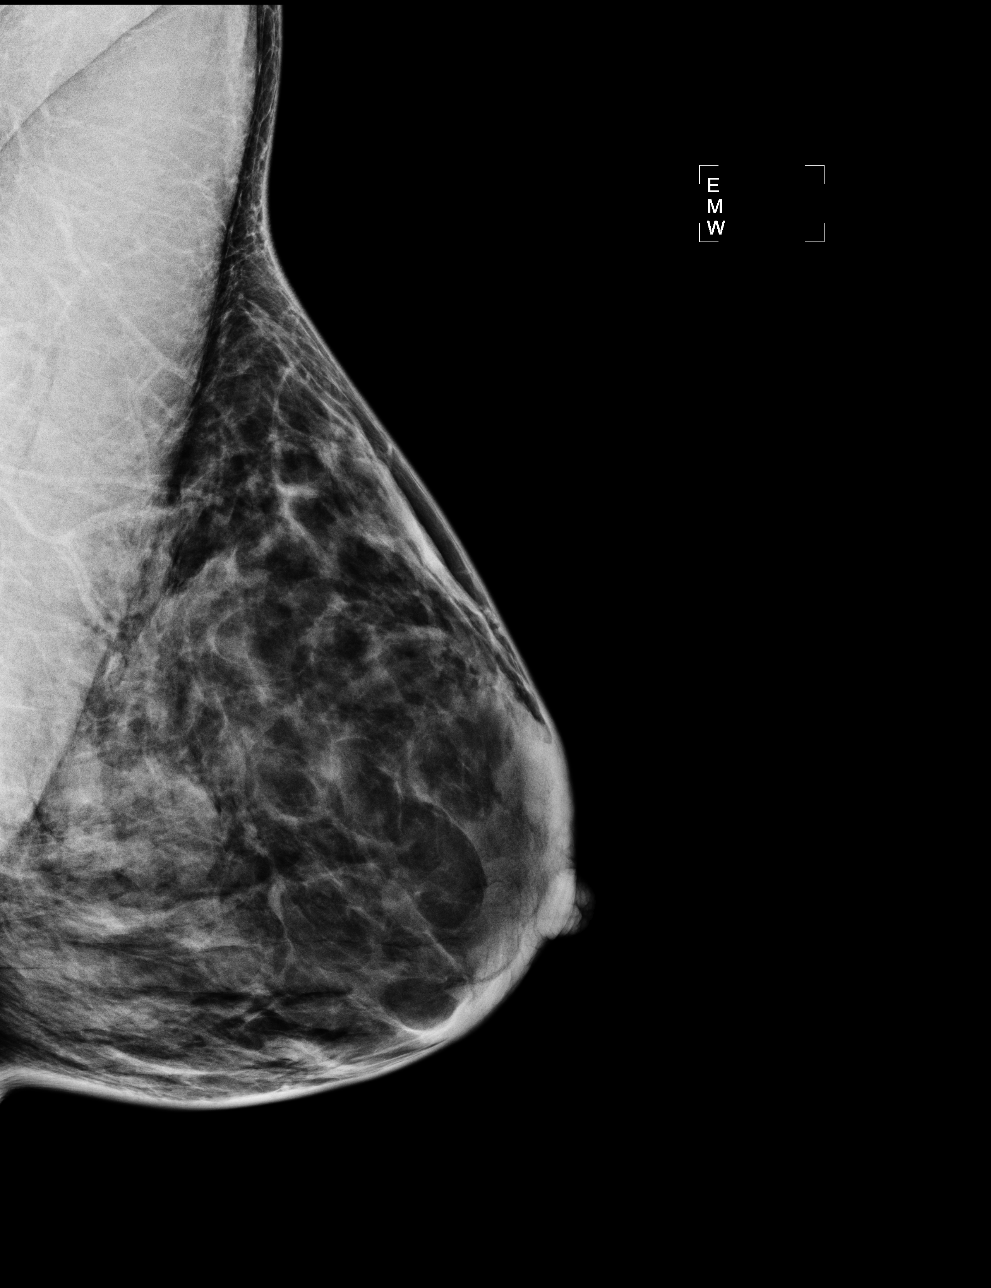

[R MLO]
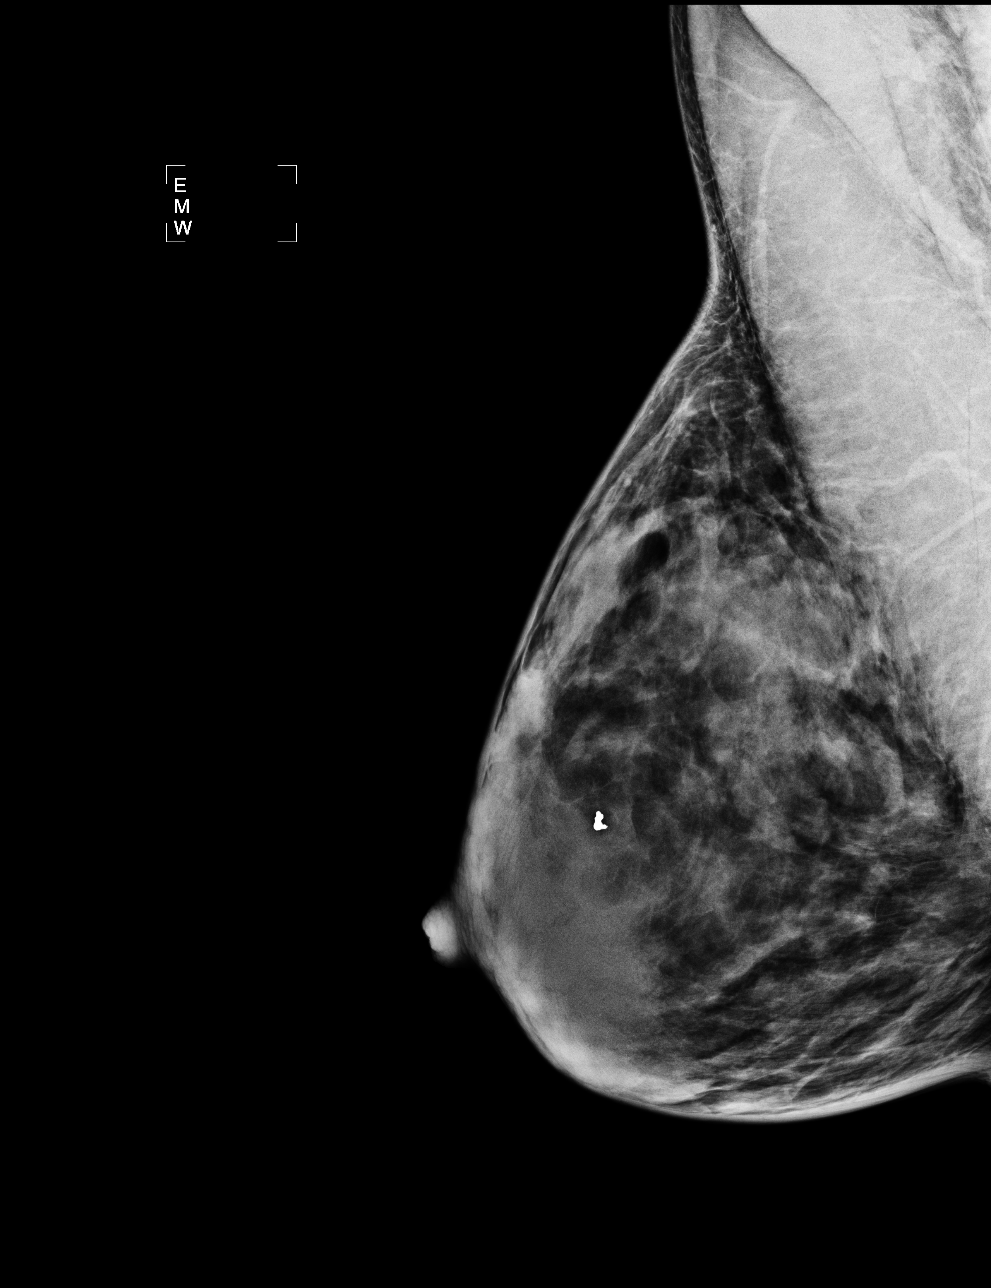

[4 of 4 positions shown; findings below may reference images not displayed]

ACR Breast Density Category d: The breast tissue is extremely dense,
which lowers the sensitivity of mammography.
FINDINGS: In the right breast, a possible mass warrants further evaluation
with spot compression views and possibly ultrasound. In the left
breast, no findings suspicious for malignancy. Images were processed
with CAD.
IMPRESSION: Further evaluation is suggested for possible mass in the right
breast.

RECOMMENDATION:
Diagnostic mammogram and possibly ultrasound of the right breast.
(Code:[id number]-Y-44Y)

The patient will be contacted regarding the findings, and additional
imaging will be scheduled.

BI-RADS CATEGORY  0: Incomplete. Need additional imaging evaluation
and/or prior mammograms for comparison.
